# Patient Record
Sex: Female | Born: 1955 | Hispanic: No | Marital: Single | Smoking: Current every day smoker
Health system: Southern US, Community
[De-identification: ages and names within clinical notes are randomized; demographics above are authoritative.]

## PROBLEM LIST (undated history)

## (undated) DIAGNOSIS — F419 Anxiety disorder, unspecified: Secondary | ICD-10-CM

## (undated) DIAGNOSIS — Z86010 Personal history of colonic polyps: Secondary | ICD-10-CM

## (undated) DIAGNOSIS — G8929 Other chronic pain: Secondary | ICD-10-CM

## (undated) DIAGNOSIS — F32A Depression, unspecified: Secondary | ICD-10-CM

## (undated) DIAGNOSIS — B1920 Unspecified viral hepatitis C without hepatic coma: Secondary | ICD-10-CM

## (undated) DIAGNOSIS — F329 Major depressive disorder, single episode, unspecified: Secondary | ICD-10-CM

## (undated) DIAGNOSIS — M549 Dorsalgia, unspecified: Secondary | ICD-10-CM

## (undated) DIAGNOSIS — T7840XA Allergy, unspecified, initial encounter: Secondary | ICD-10-CM

## (undated) DIAGNOSIS — D649 Anemia, unspecified: Secondary | ICD-10-CM

## (undated) DIAGNOSIS — C210 Malignant neoplasm of anus, unspecified: Secondary | ICD-10-CM

## (undated) DIAGNOSIS — E785 Hyperlipidemia, unspecified: Secondary | ICD-10-CM

## (undated) HISTORY — DX: Dorsalgia, unspecified: M54.9

## (undated) HISTORY — DX: Other chronic pain: G89.29

## (undated) HISTORY — DX: Hyperlipidemia, unspecified: E78.5

## (undated) HISTORY — DX: Anemia, unspecified: D64.9

## (undated) HISTORY — PX: COLONOSCOPY: SHX174

## (undated) HISTORY — DX: Allergy, unspecified, initial encounter: T78.40XA

## (undated) HISTORY — DX: Depression, unspecified: F32.A

## (undated) HISTORY — DX: Personal history of colonic polyps: Z86.010

## (undated) HISTORY — DX: Unspecified viral hepatitis C without hepatic coma: B19.20

## (undated) HISTORY — DX: Anxiety disorder, unspecified: F41.9

## (undated) HISTORY — PX: POLYPECTOMY: SHX149

## (undated) HISTORY — DX: Major depressive disorder, single episode, unspecified: F32.9

---
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
## 2009-02-17 ENCOUNTER — Emergency Department (HOSPITAL_COMMUNITY): Admission: EM | Admit: 2009-02-17 | Discharge: 2009-02-17 | Payer: Self-pay | Admitting: Emergency Medicine

## 2009-12-01 ENCOUNTER — Ambulatory Visit: Payer: Self-pay | Admitting: Family Medicine

## 2009-12-01 DIAGNOSIS — E663 Overweight: Secondary | ICD-10-CM | POA: Insufficient documentation

## 2009-12-01 DIAGNOSIS — M25569 Pain in unspecified knee: Secondary | ICD-10-CM | POA: Insufficient documentation

## 2009-12-01 DIAGNOSIS — M549 Dorsalgia, unspecified: Secondary | ICD-10-CM | POA: Insufficient documentation

## 2009-12-06 ENCOUNTER — Ambulatory Visit: Payer: Self-pay | Admitting: Family Medicine

## 2009-12-06 ENCOUNTER — Encounter: Payer: Self-pay | Admitting: Family Medicine

## 2009-12-09 ENCOUNTER — Encounter: Payer: Self-pay | Admitting: Family Medicine

## 2009-12-13 DIAGNOSIS — B1710 Acute hepatitis C without hepatic coma: Secondary | ICD-10-CM

## 2009-12-13 LAB — CONVERTED CEMR LAB
AST: 165 units/L — ABNORMAL HIGH (ref 0–37)
Albumin: 3.7 g/dL (ref 3.5–5.2)
BUN: 11 mg/dL (ref 6–23)
Calcium: 9.4 mg/dL (ref 8.4–10.5)
Chloride: 104 meq/L (ref 96–112)
Glucose, Bld: 92 mg/dL (ref 70–99)
HCV Ab: REACTIVE — AB
HDL: 32 mg/dL — ABNORMAL LOW (ref >39)
LDL Cholesterol: 126 mg/dL — ABNORMAL HIGH (ref 0–99)
Potassium: 4.4 meq/L (ref 3.5–5.3)
Sodium: 137 meq/L (ref 135–145)
Total Protein: 9 g/dL — ABNORMAL HIGH (ref 6.0–8.3)

## 2009-12-16 ENCOUNTER — Encounter: Admission: RE | Admit: 2009-12-16 | Discharge: 2009-12-16 | Payer: Self-pay | Admitting: Family Medicine

## 2009-12-16 ENCOUNTER — Encounter: Payer: Self-pay | Admitting: Family Medicine

## 2009-12-16 ENCOUNTER — Ambulatory Visit: Payer: Self-pay | Admitting: Family Medicine

## 2009-12-26 ENCOUNTER — Telehealth: Payer: Self-pay | Admitting: Family Medicine

## 2010-01-06 ENCOUNTER — Telehealth: Payer: Self-pay | Admitting: Family Medicine

## 2010-01-18 ENCOUNTER — Encounter: Payer: Self-pay | Admitting: Family Medicine

## 2010-02-14 ENCOUNTER — Ambulatory Visit: Payer: Self-pay | Admitting: Family Medicine

## 2010-02-14 DIAGNOSIS — F341 Dysthymic disorder: Secondary | ICD-10-CM

## 2010-02-28 ENCOUNTER — Ambulatory Visit: Payer: Self-pay | Admitting: Family Medicine

## 2010-02-28 DIAGNOSIS — N952 Postmenopausal atrophic vaginitis: Secondary | ICD-10-CM | POA: Insufficient documentation

## 2010-03-01 ENCOUNTER — Encounter: Payer: Self-pay | Admitting: Family Medicine

## 2010-03-06 LAB — CONVERTED CEMR LAB: Pap Smear: NEGATIVE

## 2010-03-08 ENCOUNTER — Encounter: Payer: Self-pay | Admitting: Family Medicine

## 2010-03-15 ENCOUNTER — Encounter: Payer: Self-pay | Admitting: Family Medicine

## 2010-04-04 ENCOUNTER — Telehealth: Payer: Self-pay | Admitting: Family Medicine

## 2010-04-13 ENCOUNTER — Encounter: Payer: Self-pay | Admitting: Family Medicine

## 2010-04-13 ENCOUNTER — Ambulatory Visit
Admission: RE | Admit: 2010-04-13 | Discharge: 2010-04-13 | Payer: Self-pay | Source: Home / Self Care | Attending: Gastroenterology | Admitting: Gastroenterology

## 2010-04-13 DIAGNOSIS — B182 Chronic viral hepatitis C: Secondary | ICD-10-CM

## 2010-04-24 ENCOUNTER — Telehealth: Payer: Self-pay | Admitting: Family Medicine

## 2010-04-28 ENCOUNTER — Other Ambulatory Visit: Payer: Self-pay | Admitting: Gastroenterology

## 2010-04-28 DIAGNOSIS — B1920 Unspecified viral hepatitis C without hepatic coma: Secondary | ICD-10-CM

## 2010-05-02 NOTE — Miscellaneous (Signed)
Summary: Referral  Patient came in to find out about her referral.  She has still not heard anything, and is concerned about this. Her phone number  is [phone]. Bradly Bienenstock  January 18, 2010 1:20 PM  The patient's referral was put in on 12-20-09. I have sent a flag to [facility] to find out where we are on this issue. I have no news for the patient at this time. These referrals take a while when our patient's are Bar Spec. Very Frustrating but please let her know.  Jamie Brookes MD  January 18, 2010 4:21 PM  called and lvm for pt and let her know that we are working on her referral also callled and lvm for hep c clinic for them to call me back concerning this pt's appt.Marland KitchenMarland KitchenLoralee Pacas CMA  January 27, 2010 2:23 PM  Appended Document: Referral called hep c clinic and lvm again for them to call back have NOT heard back

## 2010-05-02 NOTE — Progress Notes (Signed)
Summary: Rx Req  Phone Note Refill Request Call back at Home Phone [phone] Message from:  Patient  Refills Requested: Medication #1:  OXYCODONE HCL 10 MG TABS take 1 tab every 6 hours as needed for back and knee pain. Do not take Tylenol. Initial call taken by: Clydell Hakim,  December 26, 2009 1:47 PM    Prescriptions: OXYCODONE HCL 10 MG TABS (OXYCODONE HCL) take 1 tab every 6 hours as needed for back and knee pain. Do not take Tylenol  #60 +60 x 0   Entered and Authorized by:   Jamie Brookes MD   Signed by:   Jamie Brookes MD on 12/28/2009   Method used:   Handwritten   RxID:   [id number]

## 2010-05-02 NOTE — Assessment & Plan Note (Signed)
Summary: NP,back and knee pain, need for labs and testing   Vital Signs:  Patient profile:   55 year old female Height:      62 inches Weight:      158 pounds BMI:     29.00 Temp:     98.8 degrees F oral Pulse rate:   72 / minute BP sitting:   107 / 72  (left arm) Cuff size:   regular  Vitals Entered By: Loralee Pacas CMA (December 01, 2009 9:31 AM)  Primary Care Khloee Garza:  Jamie Brookes MD  CC:  NP visit.  History of Present Illness: 55 y/o F with multiple medical problems comes in today to establish care. She has not had any form of medical care for the last 7 years. She has not had any screening exams in 7 years as well. She comes with her boyfriend Annette Stable who knows a lot of her past medical history and helps fill in the details when she "can't remember". She says that she was diagnosed with Hep C about 20 years ago. She does not have any of that paperwork and has not done anything about it. She has not had any complications. She does have some knee pain that started in 2009. She also has some back pain that started in 2009. She has been using other peoples pain medications. She has tried Lidoderm patches, Vicoden 10/325, Robaxin 500mg , Percocets, Advil, ASA, Excedrin and Icy Hot. She says that the best combination of pain meds for her back are Percocet and Lidoderm patches together. It takes her back pain from an 8 to a 4.  Pt also has a h/o a breast lump that was found in the Rt breast about 7 years ago. She never had anything done for this.   Habits & Providers  Alcohol-Tobacco-Diet     Tobacco Status: current     Tobacco Counseling: to quit use of tobacco products     Cigarette Packs/Day: 1.0     Year Started: 1971     Pack years: 87  Exercise-Depression-Behavior     Does Patient Exercise: no     STD Risk: never     Drug Use: never     Seat Belt Use: always     Sun Exposure: infrequent  Current Medications (verified): 1)  Metamucil Multihealth Fiber 58.6 % Powd  (Psyllium) .... Otc 2)  Calcium-Vitamin D 250-125 Mg-Unit Tabs (Calcium Carbonate-Vitamin D) 3)  Oxycodone Hcl 10 Mg Tabs (Oxycodone Hcl) .... Take 1 Tab Every 6 Hours As Needed For Back and Knee Pain. Do Not Take Tylenol  Allergies (verified): No Known Drug Allergies  Past History:  Past Medical History: Diagnosed with Hep C ? > 20 years ago bilateral ankle swelling and twisting (since childhood) Rt knee pain (2009) Low back pain that radiates to Rt leg (mid calf) started in 2009  Past Surgical History: tonsilectomy  Family History: dad: 28, lung ca,  Mom: 75, DM2, Rheum arthritis, osteoporosis Sister: 80, DM2  Social History: originally from [place], moved here in 2010. Lives with Chrissie Noa ([address]) [place] [phone]) "friend' and confidant, he drives her around, never has been married, no children,  does not drink EtOH now (use to from age [age]), Smoked tobacco since age 89, now 1 ppd, smoked 1.5 ppd for 5-6 years, no drugs, no exercise, no STD's, lives with a dog and catSmoking Status:  current Packs/Day:  1.0 Does Patient Exercise:  no STD Risk:  never Drug Use:  never  Seat Belt Use:  always Sun Exposure-Excessive:  infrequent  Review of Systems        vitals reviewed and pertinent negatives and positives seen in HPI   Physical Exam  General:  Well-developed,well-nourished,in no acute distress; alert,appropriate and cooperative throughout examination Head:  Normocephalic and atraumatic without obvious abnormalities. No apparent alopecia or balding. Eyes:  No corneal or conjunctival inflammation noted. EOMI. Perrla. Funduscopic exam benign, without hemorrhages, exudates or papilledema. Vision grossly normal. Ears:  External ear exam shows no significant lesions or deformities.  Otoscopic examination reveals clear canals, tympanic membranes are intact bilaterally without bulging, retraction, inflammation or discharge. Hearing is grossly normal bilaterally. Nose:   External nasal examination shows no deformity or inflammation. Nasal mucosa are pink and moist without lesions or exudates. Mouth:  Oral mucosa and oropharynx without lesions or exudates.  Teeth in good repair. Neck:  No deformities, masses, or tenderness noted. Breasts:  No mass, nodules, thickening, tenderness, bulging, retraction, inflamation, nipple discharge or skin changes noted.  no lump felt Lungs:  Normal respiratory effort, chest expands symmetrically. Lungs are clear to auscultation, no crackles or wheezes. Heart:  Normal rate and regular rhythm. S1 and S2 normal without gallop, murmur, click, rub or other extra sounds. Abdomen:  Bowel sounds positive,abdomen soft and non-tender without masses, organomegaly or hernias noted. Msk:  No deformity or scoliosis noted of thoracic or lumbar spine.   No tenderness in left knee, Rt knee has tenderness to palpation behind the lateral aspect of the knee. no swelling, no erythema, no warmth.  Extremities:  No clubbing, cyanosis, edema, or deformity noted with normal full range of motion of all joints.     Impression & Recommendations:  Problem # 1:  BACK PAIN, CHRONIC (ICD-724.5) Assessment New Pt has been using Percocet 10-325. With her possible Hepatitis C she should not use anything with Tylenol. Also it is better for her to not use other peoples meds. When she gets [facility] set up we will do a more thoroug work up on her back to see if there is some compression in her spine or othr causes for her chronic back pain. Plan to get x-ray when she is set up with [facility]. Recommended using a heating pad as well.   Her updated medication list for this problem includes:    Oxycodone Hcl 10 Mg Tabs (Oxycodone hcl) .Marland Kitchen... Take 1 tab every 6 hours as needed for back and knee pain. do not take tylenol  Problem # 2:  KNEE PAIN, RIGHT, CHRONIC (ICD-719.46) Assessment: New Pt is also having some chronic knee pain. She has had some knee pain for 2 years. Does  not remember an initial injury. Suspect OA but will get an x-ray of her knees (standing and sitting) when she gets [facility] set up. For now her pain med for her back should help. Using ice should help as well.   Her updated medication list for this problem includes:    Oxycodone Hcl 10 Mg Tabs (Oxycodone hcl) .Marland Kitchen... Take 1 tab every 6 hours as needed for back and knee pain. do not take tylenol  Problem # 3:  ? of HEPATITIS C (ICD-070.51) Assessment: New  Pt will need blood work to determine if she has Hepatitis.   Future Orders: Comp Met-[facility] [phone]) ... 12/14/2010 Hep C Ab-[facility] ([id number]) ... 12/20/2010  Problem # 4:  ? of BREAST MASS (ICD-611.72)  Pt is  long past due for a mammogram. No breast lump felt on exam today  but will need to get diagnostic mammogram when [facility] is set up.   Future Orders: Mammogram (Diagnostic) (Mammo) ... 12/20/2010  Complete Medication List: 1)  Metamucil Multihealth Fiber 58.6 % Powd (Psyllium) .... Otc 2)  Calcium-vitamin D 250-125 Mg-unit Tabs (Calcium carbonate-vitamin d) 3)  Oxycodone Hcl 10 Mg Tabs (Oxycodone hcl) .... Take 1 tab every 6 hours as needed for back and knee pain. do not take tylenol  Other Orders: Future Orders: Lipid-[facility] ([id number]) ... 12/15/2010 Colonoscopy (Colon) ... 12/13/2010  Patient Instructions: 1)  It was nice to meet you today. 2)  Give our office a call when you have the [employer] figured out.  3)  Use frozen peas on your knees, heating pad on your back.  4)  Take Motrin (400 mg every 6 hours ) as needed for back pain.  5)  Use the oxycodone for the pain only when it is really bad.  6)  Come back when you have seen Rudell Cobb so we can start working on the other things.  Prescriptions: OXYCODONE HCL 10 MG TABS (OXYCODONE HCL) take 1 tab every 6 hours as needed for back and knee pain. Do not take Tylenol  #90 x 0   Entered and Authorized by:   Jamie Brookes MD   Signed by:   Jamie Brookes MD on  12/05/2009   Method used:   Handwritten   RxID:   [id number]   Appended Document: NP,back and knee pain, need for labs and testing Pt referred to [facility] specialty clinic on 04-13-10 at 3:00pm.

## 2010-05-02 NOTE — Progress Notes (Signed)
Summary: please let the pt know  Phone Note Call from Patient   Caller: Patient Call For: [phone] Summary of Call: Patient calling regarding referral to a clinic for her hepatitis.  Was told that we would call her with that info.  Haven't heard anything since her appt on 9/1.   Patient want to get this started asap and would like to be notified when referral is made.  Follow-up for Phone Call        The referral was made on 12-20-09 but it takes a while to get the appointment.  Follow-up by: Jamie Brookes MD,  January 06, 2010 4:44 PM  Additional Follow-up for Phone Call Additional follow up Details #1::        Please see new note and let her know.  Additional Follow-up by: Jamie Brookes MD,  January 18, 2010 4:22 PM

## 2010-05-02 NOTE — Miscellaneous (Signed)
Summary: Re:  GI referral. (can't be done at this time)  Clinical Lists Changes   received notification from [facility] that they are unable to complete the GI referral for colonscopy at this time due to lack  of physicians in this speciality group. they will notify patient when they can process the referral. Theresia Lo RN  December 09, 2009 2:05 PM  Noted. Jamie Brookes MD  December 09, 2009 2:29 PM

## 2010-05-02 NOTE — Miscellaneous (Signed)
Summary: medication question  Clinical Lists [facility] and  [facility] cannot provide Estrace cream but they can provide premarin. also they do have lidoderm patch available  if MD prescribes. form placed in MD box. .   patient is also asking about colonscopy referral. advised that there are no doctors available at this time that participate with [facility] to schedule appointment  with.    see note from 12/09/2009.  Will ask MD if referral needs to be sent to [facility] or [place] . Theresia Lo RN  March 08, 2010 11:46 AM    Medications: Changed medication from ESTRACE 0.1 MG/GM CREA (ESTRADIOL) take 2 grams IV for the first 1-2 weeks, then decreased to 1 gram 1-3 times a week for maintenence.  qs for 2 months to PREMARIN 0.625 MG/GM CREA (ESTROGENS, CONJUGATED) apply 1 gram of the cream vaginally on Sunday and Wednesday (twice weekly) - Signed Added new medication of LIDODERM 5 % PTCH (LIDOCAINE) apply to area of pain on back for no more than 12 hours - Signed Changed medication from PREMARIN 0.625 MG/GM CREA (ESTROGENS, CONJUGATED) apply 1 gram of the cream vaginally on Sunday and Wednesday (twice weekly) to PREMARIN 0.625 MG/GM CREA (ESTROGENS, CONJUGATED) apply 1 gram of the cream vaginally on Sunday and Wednesday (twice weekly) Give QS for 1 month - Signed Rx of PREMARIN 0.625 MG/GM CREA (ESTROGENS, CONJUGATED) apply 1 gram of the cream vaginally on Sunday and Wednesday (twice weekly);  #1 x 0;  Signed;  Entered by: Breyton Vanscyoc MD;  Authorized by: Yarisa Lynam MD;  Method used: Print then Give to Patient Rx of LIDODERM 5 % PTCH (LIDOCAINE) apply to area of pain on back for no more than 12 hours;  #30 x 1;  Signed;  Entered by: Lloyd Ayo MD;  Authorized by: Celester Lech MD;  Method used: Faxed to [facility], [address], [place], [place]  [address], Ph: [phone], Fax: [phone] Rx of PREMARIN 0.625 MG/GM CREA (ESTROGENS,  CONJUGATED) apply 1 gram of the cream vaginally on Sunday and Wednesday (twice weekly) Give QS for 1 month;  #1 x 3;  Signed;  Entered by: Quashon Jesus MD;  Authorized by: Kanai Hilger MD;  Method used: Faxed to [facility], [address], [place], [place]  [address], Ph: [phone], Fax: [phone]    Prescriptions: PREMARIN 0.625 MG/GM CREA (ESTROGENS, CONJUGATED) apply 1 gram of the cream vaginally on Sunday and Wednesday (twice weekly) Give QS for 1 month  #1 x 3   Entered and Authorized by:   Ikechukwu Cerny MD   Signed by:   Jury Caserta MD on 03/08/2010   Method used:   Faxed to ...       [facility] (retail)       [address]       , [place]  [address]       Ph: [phone]       Fax: [phone]   RxID:   [id number] LIDODERM 5 % PTCH (LIDOCAINE) apply to area of pain on back for no more than 12 hours  #30 x 1   Entered and Authorized by:   Sullivan Jacuinde MD   Signed by:   Shantoya Geurts MD on 03/08/2010   Method used:   Faxed to ...       [facility] (retail)       [address] [place]  [place], [place]  [address]       Ph: [phone]       Fax: [phone]   RxID:   [id number] PREMARIN 0.625 MG/GM CREA (ESTROGENS, CONJUGATED) apply 1 gram of the cream vaginally on Sunday and Wednesday (twice weekly)  #1 x 0   Entered and Authorized by:   Jamie Brookes MD   Signed by:   Jamie Brookes MD on 03/08/2010   Method used:   Print then Give to Patient   RxID:   [id number]   Appended Document: medication question Per Dr. Clotilde Dieter notified pt that dr. Clotilde Dieter can check fecal occult cards yearly, and send her for diagnositc colonoscopy if stool cards are abnormal. Also advised her that premarin and lidoderm patch were sent in.

## 2010-05-02 NOTE — Assessment & Plan Note (Signed)
Summary: pap and vaginal atrophy   Vital Signs:  Patient profile:   55 year old female Height:      62 inches Weight:      161.2 pounds BMI:     29.59 Temp:     98.4 degrees F oral Pulse rate:   73 / minute BP sitting:   149 / 72  (left arm) Cuff size:   regular  Vitals Entered By: Jimmy Footman, CMA (February 28, 2010 1:48 PM) CC: pap, vaginal atrophy Is Patient Diabetic? No Pain Assessment Patient in pain? no        Primary Care Ireene Ballowe:  Jamie Brookes MD  CC:  pap and vaginal atrophy.  History of Present Illness:  Pap: Pt comes in today for a pap smear. She has not had one for the last 5-6 years. We have no records of her pap smears. She is not having any problems. No h/o abnormal paps as far as she can remember.   Vaginal atrophy: Pt says that it is painful and unpleasent to have sex with her partner. She says that he takes Testosterone and wants to have sex all the time and that she is not interested because of the discomfort. She has had a decreased libido for the last 4 years. She has used KY jelly and says that does increase the lubrication but not the comfort or increase the desire.   Habits & Providers  Alcohol-Tobacco-Diet     Tobacco Status: current     Cigarette Packs/Day: 1.0  Current Medications (verified): 1)  Metamucil Multihealth Fiber 58.6 % Powd (Psyllium) .... Otc 2)  Calcium-Vitamin D 250-125 Mg-Unit Tabs (Calcium Carbonate-Vitamin D) 3)  Oxycodone Hcl 10 Mg Tabs (Oxycodone Hcl) .... Take 1 Tab Every 6-8 Hours As Needed For Back and Knee Pain. Do Not Take Tylenol 4)  Estrace 0.1 Mg/gm Crea (Estradiol) .... Take 2 Grams Iv For The First 1-2 Weeks, Then Decreased To 1 Gram 1-3 Times A Week For Maintenence.  Qs For 2 Months  Allergies (verified): No Known Drug Allergies  Review of Systems        vitals reviewed and pertinent negatives and positives seen in HPI   Physical Exam  Genitalia:  Normal introitus for age, no external lesions, no  vaginal discharge, mucosa pink and dry, no vaginal or cervical lesions, some vaginal atrophy, no friaility or hemorrhage, normal uterus size and position, no adnexal masses or tenderness   Impression & Recommendations:  Problem # 1:  SPECIAL SCREENING FOR MALIGNANT NEOPLASMS COLON (ICD-V76.51) Assessment New We have no record of normal or abnormal paps. Plan to get a pap smear today at the patient's request. If it is normal she will not need many more. She is post-menopausal.  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 Boyfriend wants sex often and she says it is painful because it is dry. Because of Hep C will start with lower dose of vaginal cream.   Her updated medication list for this problem includes:    Estrace 0.1 Mg/gm Crea (Estradiol) .Marland Kitchen... Take 2 grams iv for the first 1-2 weeks, then decreased to 1 gram 1-3 times a week for maintenence.  qs for 2 months  Orders: [facility]- Est Level  3 ([address])  Complete Medication List: 1)  Metamucil Multihealth Fiber 58.6 % Powd (Psyllium) .... Otc 2)  Calcium-vitamin D 250-125 Mg-unit  Tabs (Calcium carbonate-vitamin d) 3)  Oxycodone Hcl 10 Mg Tabs (Oxycodone hcl) .... Take 1 tab every 6-8 hours as needed for back and knee pain. do not take tylenol 4)  Estrace 0.1 Mg/gm Crea (Estradiol) .... Take 2 grams iv for the first 1-2 weeks, then decreased to 1 gram 1-3 times a week for maintenence.  qs for 2 months  Patient Instructions: 1)  It was nice to see you today.  2)  I refilled your Oxycodone with 2 written refill prescriptions.  3)  You will need to have a visti before 05-08-09 to get a refill written and to see how you are doing. 4)  My office will call you with results from your pap smear.  Prescriptions: ESTRACE 0.1 MG/GM CREA (ESTRADIOL) take 2 grams IV for the first 1-2 weeks, then decreased  to 1 gram 1-3 times a week for maintenence.  qs for 2 months  #1 x 0   Entered and Authorized by:   Jamie Brookes MD   Signed by:   Jamie Brookes MD on 02/28/2010   Method used:   Handwritten   RxID:   [id number] OXYCODONE HCL 10 MG TABS (OXYCODONE HCL) take 1 tab every 6-8 hours as needed for back and knee pain. Do not take Tylenol  #90 x 0   Entered and Authorized by:   Jamie Brookes MD   Signed by:   Jamie Brookes MD on 02/28/2010   Method used:   Print then Give to Patient   RxID:   [id number]    Orders Added: 1)  Pap Smear-[facility] [[id number]] 2)  [facility]- Est Level  3 [01027]

## 2010-05-02 NOTE — Miscellaneous (Signed)
Summary: sent medical records  Clinical Lists Changes sent medical records to disability determination services all OV in 2011. [employer] [place]

## 2010-05-02 NOTE — Assessment & Plan Note (Signed)
Summary: Hep C, Depresion, Back pain   Vital Signs:  Patient profile:   56 year old female Weight:      159 pounds Temp:     98.5 degrees F oral Pulse rate:   67 / minute Pulse rhythm:   regular BP sitting:   135 / 82  (left arm) Cuff size:   regular  Vitals Entered By: Loralee Pacas CMA (February 14, 2010 11:06 AM) CC: Hep C, depression, backand knee pain Is Patient Diabetic? No   Primary Care Provider:  Jamie Brookes MD  CC:  Hep C, depression, and backand knee pain.  History of Present Illness: Hep C: Discussed with patient the issues of not sharing anything that has blood with her partner (no toothbrushes, nail clippers etc...). She has gotten an appointment with the Hepatitis clinic on Jan 12 at 2:45. Pt given my card so they can have reports and results sent to me as well.   Depression: Pt has some depression with her diagnosis and her pain issues (back and knee). She wants to approach it in a natural way if she can. We discussed foods that are high in Tryptophan to that she can help boost her serotonin levels. Pt given printed page about a website that has more info on this topic. Pt is welcome to start antidepressents today but did not want to do this. She will contact me in the future if she would like to start a pill.   Back and Knee pain: Pt says that her pain is not completely controlled with only 2 pills of Oxycodone a day. Her partner gives her the meds every day and she does not take more than she should but says that her pain is coming back every day. She would like to go back up to 3 pills a day. Her pain is waking her up at 3am in the morning. Pt had back pain that started in the 1980's, was worse in the 1990'sand has gotten even worse in the 2000's.   Preventive: Pt wanted a GYN referral to get pap smears done. I let her know we can do them here.   Habits & Providers  Alcohol-Tobacco-Diet     Tobacco Status: current     Tobacco Counseling: to quit use of  tobacco products     Cigarette Packs/Day: 1.0     Year Started: 1971     Pack years: 73  Exercise-Depression-Behavior     Have you felt down or hopeless? yes     Have you felt little pleasure in things? yes     Depression Counseling: further diagnostic testing and/or other treatment is indicated     Seat Belt Use: always  Current Medications (verified): 1)  Metamucil [employer] Fiber 58.6 % Powd (Psyllium) .... Otc 2)  Calcium-Vitamin D 250-125 Mg-Unit Tabs (Calcium Carbonate-Vitamin D) 3)  Oxycodone Hcl 10 Mg Tabs (Oxycodone Hcl) .... Take 1 Tab Every 6-8 Hours As Needed For Back and Knee Pain. Do Not Take Tylenol  Allergies (verified): No Known Drug Allergies  Review of Systems        vitals reviewed and pertinent negatives and positives seen in HPI   Physical Exam  General:  Well-developed,well-nourished,in no acute distress; alert,appropriate and cooperative throughout examination Psych:  normally interactive, good eye contact, and dysphoric affect.     Impression & Recommendations:  Problem # 1:  HEPATITIS C (ICD-070.51) Assessment Unchanged Pt has an appointment with the Hep C clinic coming up. encouraged to make it  to that appointment. Pt appears motivated to see what can be done for this disease.   Orders: [facility]- Est  Level 4 (16109)  Problem # 2:  DYSTHYMIC DISORDER (ICD-300.4) Assessment: Deteriorated Pt has a PHQ9 score of 19 and notes that it is very Difficult taking care of things at home. Her friend and confidant who lives with her Annette Stable) helps her do many things including take her meds at the proper times. I suspect that a lot of this is related to her diagnosis of Hep C and having just moved to this area from [place] and having some chronic pain issues. Pt does not want to take meds at this time. Discussed foods that increase Serotonin ad pt given website to look up. Will discuss with her again when she comes for her pap smear.   Orders: [facility]- Est  Level 4  (99214)  Problem # 3:  BACK PAIN, CHRONIC (ICD-724.5) Assessment: Unchanged Pt has had back pain since the 1980's. I would consider PT for this patient in the future (after she has a handle on what's happening witn her liver). Will treat the pain with Oxycodone and go back to up 3 times a day like we were doing before.   Her updated medication list for this problem includes:    Oxycodone Hcl 10 Mg Tabs (Oxycodone hcl) .Marland Kitchen... Take 1 tab every 6-8 hours as needed for back and knee pain. do not take tylenol  Orders: [facility]- Est  Level 4 (99214)  Complete Medication List: 1)  Metamucil [employer] Fiber 58.6 % Powd (Psyllium) .... Otc 2)  Calcium-vitamin D 250-125 Mg-unit Tabs (Calcium carbonate-vitamin d) 3)  Oxycodone Hcl 10 Mg Tabs (Oxycodone hcl) .... Take 1 tab every 6-8 hours as needed for back and knee pain. do not take tylenol  Patient Instructions: 1)  Please schedule a pap smear at the front desk as you leave.  2)  I have increased your Oxycodone.  3)  I'm glad you have an appointment with the liver doctor.  Prescriptions: OXYCODONE HCL 10 MG TABS (OXYCODONE HCL) take 1 tab every 6-8 hours as needed for back and knee pain. Do not take Tylenol  #90 x 0   Entered and Authorized by:   Jamie Brookes MD   Signed by:   Jamie Brookes MD on 02/20/2010   Method used:   Handwritten   RxID:   [id number]    Orders Added: 1)  [facility]- Est  Level 4 [95621]

## 2010-05-03 ENCOUNTER — Encounter: Payer: Self-pay | Admitting: *Deleted

## 2010-05-04 NOTE — Miscellaneous (Signed)
Summary: [facility] Rx's to fax  Clinical Lists Changes  Medications: Changed medication from LIDODERM 5 % PTCH (LIDOCAINE) apply to area of pain on back for no more than 12 hours to LIDODERM 5 % PTCH (LIDOCAINE) apply to area of pain on back for no more than 12 hours - Signed Changed medication from PREMARIN 0.625 MG/GM CREA (ESTROGENS, CONJUGATED) apply 1 gram of the cream vaginally on Sunday and Wednesday (twice weekly) Give QS for 1 month to PREMARIN 0.625 MG/GM CREA (ESTROGENS, CONJUGATED) apply 1 gram of the cream vaginally on Sunday and Wednesday (twice weekly) Give QS for 90 days - Signed Rx of LIDODERM 5 % PTCH (LIDOCAINE) apply to area of pain on back for no more than 12 hours;  #90 x 1;  Signed;  Entered by: Jamie Brookes MD;  Authorized by: Jamie Brookes MD;  Method used: Print then Give to Patient Rx of PREMARIN 0.625 MG/GM CREA (ESTROGENS, CONJUGATED) apply 1 gram of the cream vaginally on Sunday and Wednesday (twice weekly) Give QS for 90 days;  #90 x 1;  Signed;  Entered by: Jamie Brookes MD;  Authorized by: Jamie Brookes MD;  Method used: Print then Give to Patient    Prescriptions: PREMARIN 0.625 MG/GM CREA (ESTROGENS, CONJUGATED) apply 1 gram of the cream vaginally on Sunday and Wednesday (twice weekly) Give QS for 90 days  #90 x 1   Entered and Authorized by:   Jamie Brookes MD   Signed by:   Jamie Brookes MD on 03/15/2010   Method used:   Print then Give to Patient   RxID:   [id number] LIDODERM 5 % PTCH (LIDOCAINE) apply to area of pain on back for no more than 12 hours  #90 x 1   Entered and Authorized by:   Jamie Brookes MD   Signed by:   Jamie Brookes MD on 03/15/2010   Method used:   Print then Give to Patient   RxID:   [id number]

## 2010-05-04 NOTE — Progress Notes (Signed)
Summary: Referral  Phone Note Call from Patient Call back at Home Phone [phone]   Reason for Call: Talk to Nurse, Referral Summary of Call: pt was suppose to get colonoscopy, has orange card but no doctors avail right now, pt says we suggested another type test, wants to go for that but not sure what the name of it was.  Initial call taken by: Knox Royalty,  April 04, 2010 11:43 AM  Follow-up for Phone Call        will forward message to MD .  Follow-up by: Theresia Lo RN,  April 04, 2010 11:53 AM  Additional Follow-up for Phone Call Additional follow up Details #1::        LM on phone for patient, there is a group doing a study in our office looking at access to colonoscopy's. I will talk to them next week (they are not here this week) and see how we can best help her get access to come type of screening and if she would qualify for their study.  Additional Follow-up by: Jamie Brookes MD,  April 04, 2010 1:56 PM

## 2010-05-04 NOTE — Progress Notes (Signed)
Summary: Oxycodone refill   Phone Note Refill Request   Refills Requested: Medication #1:  OXYCODONE HCL 10 MG TABS take 1 tab every 6-8 hours as needed for back and knee pain. Do not take Tylenol Initial call taken by: Abundio Miu,  April 24, 2010 10:38 AM    New/Updated Medications: OXYCODONE HCL 10 MG TABS (OXYCODONE HCL) take 1 tab every 6-8 hours as needed for back and knee pain. Do not take Tylenol Prescriptions: OXYCODONE HCL 10 MG TABS (OXYCODONE HCL) take 1 tab every 6-8 hours as needed for back and knee pain. Do not take Tylenol  #90 x 0   Entered and Authorized by:   Jamie Brookes MD   Signed by:   Jamie Brookes MD on 04/26/2010   Method used:   Handwritten   RxID:   [id number]

## 2010-05-09 ENCOUNTER — Other Ambulatory Visit: Payer: Self-pay | Admitting: Interventional Radiology

## 2010-05-09 ENCOUNTER — Ambulatory Visit (HOSPITAL_COMMUNITY): Payer: Self-pay

## 2010-05-09 ENCOUNTER — Ambulatory Visit (HOSPITAL_COMMUNITY)
Admission: RE | Admit: 2010-05-09 | Discharge: 2010-05-09 | Disposition: A | Discharge status: Home / Self Care | Payer: Self-pay | Source: Ambulatory Visit | Attending: Gastroenterology | Admitting: Gastroenterology

## 2010-05-09 DIAGNOSIS — B1920 Unspecified viral hepatitis C without hepatic coma: Secondary | ICD-10-CM | POA: Insufficient documentation

## 2010-05-09 LAB — CBC
MCV: 91.6 fL (ref 78.0–100.0)
Platelets: 160 10*3/uL (ref 150–400)
RBC: 4.63 MIL/uL (ref 3.87–5.11)
RDW: 13.1 % (ref 11.5–15.5)
WBC: 8.4 10*3/uL (ref 4.0–10.5)

## 2010-05-09 LAB — APTT: aPTT: 30 seconds (ref 24–37)

## 2010-05-09 LAB — PROTIME-INR
INR: 1.15 (ref 0.00–1.49)
Prothrombin Time: 14.9 seconds (ref 11.6–15.2)

## 2010-05-10 NOTE — Consult Note (Signed)
Summary: [facility]  [facility]   Imported By: Knox Royalty 04/27/2010 12:02:59  _____________________________________________________________________  External Attachment:    Type:   Image     Comment:   External Document

## 2010-05-30 ENCOUNTER — Ambulatory Visit (INDEPENDENT_AMBULATORY_CARE_PROVIDER_SITE_OTHER): Payer: Self-pay

## 2010-05-30 ENCOUNTER — Telehealth: Payer: Self-pay | Admitting: *Deleted

## 2010-05-30 ENCOUNTER — Inpatient Hospital Stay (INDEPENDENT_AMBULATORY_CARE_PROVIDER_SITE_OTHER)
Admission: RE | Admit: 2010-05-30 | Discharge: 2010-05-30 | Disposition: A | Discharge status: Home / Self Care | Payer: Self-pay | Source: Ambulatory Visit | Attending: Family Medicine | Admitting: Family Medicine

## 2010-05-30 DIAGNOSIS — J40 Bronchitis, not specified as acute or chronic: Secondary | ICD-10-CM

## 2010-05-30 NOTE — Telephone Encounter (Signed)
Patient brings in form form [employer]  that needs original signature form MD . Lisa Nolan be a photcopy. Form placed in MD box. Please call when completed at [phone]

## 2010-05-30 NOTE — Telephone Encounter (Signed)
Form signed by Dr. Clotilde Dieter . Message left on voicemail that  form is ready for pick up.

## 2010-07-05 LAB — URINALYSIS, ROUTINE W REFLEX MICROSCOPIC
Bilirubin Urine: NEGATIVE
Ketones, ur: NEGATIVE mg/dL
Nitrite: NEGATIVE
Protein, ur: NEGATIVE mg/dL
Urobilinogen, UA: 1 mg/dL (ref 0.0–1.0)

## 2010-07-26 ENCOUNTER — Other Ambulatory Visit: Payer: Self-pay | Admitting: Family Medicine

## 2010-07-26 ENCOUNTER — Telehealth: Payer: Self-pay | Admitting: Family Medicine

## 2010-07-26 MED ORDER — OXYCODONE HCL 10 MG PO TABS: ORAL_TABLET | ORAL | Status: DC

## 2010-07-26 NOTE — Telephone Encounter (Signed)
LVM for patient to call back to inform of below 

## 2010-07-26 NOTE — Telephone Encounter (Signed)
Needs refill on Oxycodone Please call when ready

## 2010-07-26 NOTE — Telephone Encounter (Signed)
Spoke with patient and informed of below 

## 2010-07-26 NOTE — Telephone Encounter (Signed)
Please let her know that she can pick u the Rx at the front desk. She is not actually due to refills until after the beginning of May but I have dated all the next 3 month prescriptions for the first of the month.  Please let her know.  Thanks.  Hospital doctor

## 2010-10-11 ENCOUNTER — Other Ambulatory Visit: Payer: Self-pay | Admitting: Family Medicine

## 2010-10-11 MED ORDER — ESTROGENS, CONJUGATED 0.625 MG/GM VA CREA: TOPICAL_CREAM | VAGINAL | Status: DC

## 2010-10-28 ENCOUNTER — Encounter: Payer: Self-pay | Admitting: Family Medicine

## 2010-10-31 ENCOUNTER — Encounter: Payer: Self-pay | Admitting: Family Medicine

## 2010-10-31 NOTE — Telephone Encounter (Signed)
De Nurse  spoke with patient and scheduled appointment with new PCP for 11/08/2010. Patient is requesting refill on pain medication now. Her last refill was 10/01/2010.   On 07/26/2010   Dr. Clotilde Dieter gave rx for oxycodone for  05/01, 06/01 and 07 /01. Patient wants to  have refilled now. Will forward to MD.

## 2010-10-31 NOTE — Telephone Encounter (Signed)
This encounter was created in error - please disregard.

## 2010-11-01 ENCOUNTER — Telehealth: Payer: Self-pay | Admitting: Family Medicine

## 2010-11-01 ENCOUNTER — Other Ambulatory Visit: Payer: Self-pay | Admitting: Family Medicine

## 2010-11-01 MED ORDER — OXYCODONE HCL 10 MG PO TABS: ORAL_TABLET | ORAL | Status: DC

## 2010-11-01 NOTE — Telephone Encounter (Signed)
rx re written by hand for 1 po q 8 prn pain #30 0 R and given to [employer]

## 2010-11-01 NOTE — Telephone Encounter (Signed)
Chart review for oxycodone--looks like she got 3 rx for 100 each--due on 1st of month---has appt next week w new provider Will give #30 for interim..she should be taking probably 3 a day at max although written for  q 4-6 Lisa Nolan   Rx Given to [employer]

## 2010-11-01 NOTE — Telephone Encounter (Signed)
Pt has an appt w/ de [facility] cru[facility] on 8/7 and she is out of her Lidaderm and oxycodone.  Needs enough to last until appt. Out pt Pharm

## 2010-11-07 ENCOUNTER — Encounter: Payer: Self-pay | Admitting: Family Medicine

## 2010-11-07 ENCOUNTER — Ambulatory Visit: Payer: Self-pay | Admitting: Family Medicine

## 2010-11-07 ENCOUNTER — Ambulatory Visit (INDEPENDENT_AMBULATORY_CARE_PROVIDER_SITE_OTHER): Payer: Self-pay | Admitting: Family Medicine

## 2010-11-07 VITALS — BP 127/79 | HR 84 | Temp 98.2°F | Ht 63.0 in | Wt 164.0 lb

## 2010-11-07 DIAGNOSIS — H9209 Otalgia, unspecified ear: Secondary | ICD-10-CM | POA: Insufficient documentation

## 2010-11-07 DIAGNOSIS — Z1239 Encounter for other screening for malignant neoplasm of breast: Secondary | ICD-10-CM

## 2010-11-07 DIAGNOSIS — Z23 Encounter for immunization: Secondary | ICD-10-CM

## 2010-11-07 DIAGNOSIS — M549 Dorsalgia, unspecified: Secondary | ICD-10-CM

## 2010-11-07 DIAGNOSIS — L814 Other melanin hyperpigmentation: Secondary | ICD-10-CM

## 2010-11-07 DIAGNOSIS — G8929 Other chronic pain: Secondary | ICD-10-CM

## 2010-11-07 DIAGNOSIS — L819 Disorder of pigmentation, unspecified: Secondary | ICD-10-CM

## 2010-11-07 DIAGNOSIS — Z1231 Encounter for screening mammogram for malignant neoplasm of breast: Secondary | ICD-10-CM

## 2010-11-07 MED ORDER — TETANUS-DIPHTH-ACELL PERTUSSIS 5-2.5-18.5 LF-MCG/0.5 IM SUSP: 0.5000 mL | Freq: Once | INTRAMUSCULAR | Status: DC

## 2010-11-07 MED ORDER — OXYCODONE HCL 10 MG PO TABS: ORAL_TABLET | ORAL | Status: DC

## 2010-11-07 NOTE — Patient Instructions (Signed)
Nice to meet you today. Please tell liver specialist about your ear pain. Please schedule a follow up visit with me in 4 weeks. Thank you.

## 2010-11-07 NOTE — Progress Notes (Signed)
  Subjective:    Patient ID: Lisa Nolan, female    DOB: [birth year], 55 y.o.   MRN: [id number]  HPI Patient presents to clinic with CC: ear odor and pain.  Symptoms started 2-3 weeks ago.  Pain has improved on its own, but patient concerned this is a side effect of Hepatitis C treatment.  She started taking medication for Hep C 3 months ago.  Associated symptoms: congestion.  Denies fever, chills, sweats, headache, nausea/vomiting.  Denies any associate URI symptoms.    Patient also needs refill Oxycodone for chronic back pain.  Patient agrees to discuss this further at next appointment.  She agrees to renewing pain contract.  Wants DEXA scan and mammogram referrals today.  Patient concerned about new "spot" on R cheek that is growing and changing shape.  She noticed it 2 weeks ago.  Denies any drainage from site.  Non-tender.  Denies itchiness.   Review of Systems     Objective:   Physical Exam  Constitutional: She appears well-developed and well-nourished. No distress.  HENT:  Head: Normocephalic and atraumatic.  Right Ear: Hearing and external ear normal. No drainage, swelling or tenderness. Tympanic membrane is scarred and erythematous. Tympanic membrane is not injected, not perforated and not bulging.  Left Ear: Hearing and external ear normal. No drainage, swelling or tenderness. Tympanic membrane is not injected, not scarred, not perforated, not erythematous and not bulging.  Skin:       Two 1.5 mm round, light brown plaques on R cheek; no erythema, no drainage         Assessment & Plan:

## 2010-11-07 NOTE — Assessment & Plan Note (Signed)
Ear pain may be side effect of Hep C medications. Discussed with Pharm student - Ribavirin may cause ear problems. Advised patient to bring issue up to Liver specialist this week. No evidence of otitis media/infection at this time. Return to clinic if symptoms worsen or develop fever, ear drainage, severe pain.

## 2010-11-07 NOTE — Assessment & Plan Note (Signed)
Few light brown, velvety moles on cheek are likely age spots. Advised patient to monitor for shape change or growth.  Will monitor closely.  Consider biopsy if suspicion for melanoma.

## 2010-11-07 NOTE — Assessment & Plan Note (Signed)
Will refill oxycodone today - 1 month supply. Patient to return to clinic in 4 weeks to discuss chronic pain. Re-new pain contract at that time.

## 2010-11-21 ENCOUNTER — Emergency Department (HOSPITAL_COMMUNITY)
Admission: EM | Admit: 2010-11-21 | Discharge: 2010-11-21 | Disposition: A | Discharge status: Home / Self Care | Payer: Self-pay | Attending: Emergency Medicine | Admitting: Emergency Medicine

## 2010-11-21 DIAGNOSIS — H60399 Other infective otitis externa, unspecified ear: Secondary | ICD-10-CM | POA: Insufficient documentation

## 2010-11-21 DIAGNOSIS — I10 Essential (primary) hypertension: Secondary | ICD-10-CM | POA: Insufficient documentation

## 2010-11-21 DIAGNOSIS — H9209 Otalgia, unspecified ear: Secondary | ICD-10-CM | POA: Insufficient documentation

## 2010-11-21 DIAGNOSIS — B1920 Unspecified viral hepatitis C without hepatic coma: Secondary | ICD-10-CM | POA: Insufficient documentation

## 2010-11-28 ENCOUNTER — Encounter: Payer: Self-pay | Admitting: Family Medicine

## 2010-11-28 ENCOUNTER — Ambulatory Visit (INDEPENDENT_AMBULATORY_CARE_PROVIDER_SITE_OTHER): Payer: Self-pay | Admitting: Family Medicine

## 2010-11-28 DIAGNOSIS — H9209 Otalgia, unspecified ear: Secondary | ICD-10-CM

## 2010-11-28 DIAGNOSIS — M549 Dorsalgia, unspecified: Secondary | ICD-10-CM

## 2010-11-28 MED ORDER — AMOXICILLIN 500 MG PO CAPS: 500.0000 mg | ORAL_CAPSULE | Freq: Two times a day (BID) | ORAL | Status: DC

## 2010-11-28 MED ORDER — OXYCODONE HCL 10 MG PO TABS: ORAL_TABLET | ORAL | Status: DC

## 2010-11-28 NOTE — Patient Instructions (Addendum)
It was nice to see you again. Please pick up Amoxicillin and take for 10 days. Please schedule a follow up appointment with me in 6 - 8 weeks.

## 2010-11-29 NOTE — Assessment & Plan Note (Signed)
Will need to discuss further at next visit. Will need to renew pain contract at that time. Will refill Oxycodone 90 day supply for now - pain is well controlled on current regimen. Follow up in 6 to 8 weeks.

## 2010-11-29 NOTE — Assessment & Plan Note (Signed)
Because patient's swap audiometry test revealed decreased hearing R ear, will treat with high dose Amoxicillin x 10 days. Advised patient to call liver specialist to make sure antibiotic safe to use. Will repeat ear exam in 6 to 8 weeks or sooner if symptoms worsen. Will repeat hearing test in 6 to 8 weeks.  If continues to endorse decreased hearing, will likely refer to ENT/Audiology.

## 2010-11-29 NOTE — Progress Notes (Signed)
  Subjective:    Patient ID: Lisa Nolan, female    DOB: Oct 06, 1955, 55 y.o.   MRN: [id number]  HPI  Patient complains of persistent R ear pain and decreased hearing.  Patient went to Urgent Care 3 weeks ago and was told she has otitis externa and given drops.  Pain has mostly resolved, but now patient complains of decreased hearing.  Started 3 weeks ago, but has become progressively worse.  Hears a "humming" in R ear.  Wants ears to be rechecked today.  Also, patient asked liver specialist denied any ototoxicity due to PEG-INF and Ribavirin.    Patient needs oxycodone refill for chronic back and knee pain.  Review of Systems Per HPI    Objective:   Physical Exam  Constitutional: No distress.  HENT:  Right Ear: There is swelling. No tenderness. No foreign bodies. Tympanic membrane is erythematous. Tympanic membrane is not retracted and not bulging. Tympanic membrane mobility is abnormal. No middle ear effusion. Decreased hearing is noted.  Left Ear: Hearing, tympanic membrane, external ear and ear canal normal.       R ear flushed, normal insufflation, normal rinne and weber exam, sweep audiometry revealed R side decreased hearing  Lymphadenopathy:    She has no cervical adenopathy.          Assessment & Plan:

## 2011-01-02 ENCOUNTER — Encounter: Payer: Self-pay | Admitting: Family Medicine

## 2011-01-02 ENCOUNTER — Ambulatory Visit (INDEPENDENT_AMBULATORY_CARE_PROVIDER_SITE_OTHER): Payer: Self-pay | Admitting: Family Medicine

## 2011-01-02 VITALS — BP 132/77 | HR 76 | Temp 98.3°F | Ht 63.0 in | Wt 158.4 lb

## 2011-01-02 DIAGNOSIS — F17200 Nicotine dependence, unspecified, uncomplicated: Secondary | ICD-10-CM

## 2011-01-02 DIAGNOSIS — Z23 Encounter for immunization: Secondary | ICD-10-CM

## 2011-01-02 DIAGNOSIS — M549 Dorsalgia, unspecified: Secondary | ICD-10-CM

## 2011-01-02 MED ORDER — OXYCODONE HCL 10 MG PO TABS: 10.0000 mg | ORAL_TABLET | Freq: Three times a day (TID) | ORAL | Status: DC | PRN

## 2011-01-02 MED ORDER — PNEUMOCOCCAL VAC POLYVALENT 25 MCG/0.5ML IJ INJ: 0.5000 mL | INJECTION | Freq: Once | INTRAMUSCULAR | Status: DC

## 2011-01-02 MED ORDER — OXYCODONE HCL 10 MG PO TABS: ORAL_TABLET | ORAL | Status: DC

## 2011-01-02 NOTE — Patient Instructions (Signed)
Please schedule appointment with Dr. Raymondo Band to discuss smoking cessation. You may schedule a follow up appointment with me in 3 months. Thank you.

## 2011-01-06 DIAGNOSIS — F17210 Nicotine dependence, cigarettes, uncomplicated: Secondary | ICD-10-CM | POA: Insufficient documentation

## 2011-01-06 NOTE — Assessment & Plan Note (Signed)
Advised patient to schedule appointment with Dr. Raymondo Band to discuss smoking cessation options that do not interfere with hepatitis medications/treatment.

## 2011-01-06 NOTE — Progress Notes (Signed)
  Subjective:    Patient ID: KAYELYN LEMON, female    DOB: Jul 25, 1955, 55 y.o.   MRN: [id number]  HPI    Review of Systems     Objective:   Physical Exam        Assessment & Plan:   Subjective:    NEVAYAH FAUST is a 55 y.o. female who presents for follow up of low back problems. Current symptoms include: pain in lumbar spine (aching, shooting and stabbing in character; 8/10 in severity). Currently takes oxycodone because she cannot take tylenol due to acute hepatitis.  Takes oxycodone 1 to 3 tablets per day depending on severity.  Has been taking this since 2010 when she was diagnosed with DJD of thoracic and lumbar spine.  Pain located mostly lower back and radiates to left lower extremity intermittently.  Oxycodone brings pain from 8 to 5. Lidoderm patches help relieve pain also.  Symptoms have not changed from the previous visit. Exacerbating factors identified by the patient are bending forwards and walking.    The following portions of the patient's history were reviewed and updated as appropriate: allergies, current medications, past medical history and past surgical history.    Objective:    General appearance: alert, cooperative and no distress Back: no skin lesions, erythema, or scars, symmetric, no curvature. ROM normal. No CVA tenderness., tender on palpation of lumbar spine and paraspinal muscles, mild pain with SLE Lungs: clear to auscultation bilaterally Heart: regular rate and rhythm Abdomen: soft, non-tender; bowel sounds normal Extremities: extremities normal, atraumatic, no cyanosis or edema    Assessment:    Sciatica possibly due to degenerative joint disease at intervertebral facet joints  vs. Chronic pain syndrome due to DJD   Plan:    Natural history and expected course discussed. Questions answered. Stretching exercises discussed. Ice to affected area as needed for local pain relief. Heat to affected area as needed for local pain relief. Follow-up  in 3 months.

## 2011-01-06 NOTE — Assessment & Plan Note (Signed)
Will give 90 day refill of Oxycodone. Obtained pain contract - patient signed. May consider referral to pain clinic in the future if patient shows signs of addiction or overuse. Will follow up in 3 months. Avoid Tylenol in setting of acute hepatitis.

## 2011-02-19 ENCOUNTER — Ambulatory Visit
Admission: RE | Admit: 2011-02-19 | Discharge: 2011-02-19 | Disposition: A | Discharge status: Home / Self Care | Payer: Self-pay | Source: Ambulatory Visit | Attending: Family Medicine | Admitting: Family Medicine

## 2011-02-19 DIAGNOSIS — Z780 Asymptomatic menopausal state: Secondary | ICD-10-CM

## 2011-02-19 DIAGNOSIS — Z1231 Encounter for screening mammogram for malignant neoplasm of breast: Secondary | ICD-10-CM

## 2011-02-19 DIAGNOSIS — M549 Dorsalgia, unspecified: Secondary | ICD-10-CM

## 2011-02-19 DIAGNOSIS — G8929 Other chronic pain: Secondary | ICD-10-CM

## 2011-02-27 ENCOUNTER — Encounter: Payer: Self-pay | Admitting: Family Medicine

## 2011-03-07 ENCOUNTER — Telehealth: Payer: Self-pay | Admitting: Family Medicine

## 2011-03-07 DIAGNOSIS — Z973 Presence of spectacles and contact lenses: Secondary | ICD-10-CM

## 2011-03-07 NOTE — Telephone Encounter (Signed)
Will ask pcp to place referral for opthalmology.Laureen Ochs, Viann Shove

## 2011-03-07 NOTE — Telephone Encounter (Signed)
Pt has an orange card and wants to be referred to an eye doctor, needs new glasses

## 2011-03-07 NOTE — Telephone Encounter (Signed)
Referral ordered.  Please call patient with appointment time/date.  THANKS.

## 2011-03-29 ENCOUNTER — Other Ambulatory Visit: Payer: Self-pay | Admitting: Family Medicine

## 2011-03-29 NOTE — Telephone Encounter (Signed)
Set up appointment for 1/2 @ 2:30pm for patient. Patient has been notified of this while on the phone with me.Lisa Nolan

## 2011-03-29 NOTE — Telephone Encounter (Signed)
Lisa Nolan needs a refill on her Oxycodone.

## 2011-04-04 ENCOUNTER — Ambulatory Visit (INDEPENDENT_AMBULATORY_CARE_PROVIDER_SITE_OTHER): Payer: Self-pay | Admitting: Family Medicine

## 2011-04-04 ENCOUNTER — Encounter: Payer: Self-pay | Admitting: Family Medicine

## 2011-04-04 VITALS — BP 138/80 | HR 69 | Temp 98.1°F | Ht 63.0 in | Wt 160.0 lb

## 2011-04-04 DIAGNOSIS — M549 Dorsalgia, unspecified: Secondary | ICD-10-CM

## 2011-04-04 MED ORDER — OXYCODONE HCL 10 MG PO TABS: 10.0000 mg | ORAL_TABLET | Freq: Three times a day (TID) | ORAL | Status: DC | PRN

## 2011-04-04 NOTE — Assessment & Plan Note (Signed)
Will refill oxycodone 10 mg for 3 months. Patient hopes to have back surgery once her hepatitis C study is over. Will continue to check repeat back pain with oxycodone until then.

## 2011-04-04 NOTE — Progress Notes (Signed)
  Subjective:    Patient ID: Lisa Nolan, female    DOB: [birth year], 56 y.o.   MRN: [id number]  HPI Patient presents to clinic for refill oxycodone for chronic low back pain. Patient is currently in a trial for hepatitis C treatment and cannot take Tylenol for pain. Patient plans to undergo back surgery once liver issue has resolved. Patient will be in trial for about 8 more months. Patient comes in every 3 months for refills. She has not asked for early refills or increase in doses. Complains of constipation, but takes Metamucil and eat a high-fiber diet.   Review of Systems Denies fever, chills, nausea or vomiting, chest pain, difficulty breathing.    Objective:   Physical Exam  Gen.: Pleasant, cooperative, in no distress Heart: Regular rate rhythm Lungs: Clear to auscultation bilaterally Back: Tenderness to palpation lumbar paraspinal muscles       Assessment & Plan:

## 2011-04-04 NOTE — Patient Instructions (Signed)
Please schedule follow up appointment in 3 months. Increase fiber in diet and continue Metamucil. Have a happy new year.

## 2011-05-08 ENCOUNTER — Telehealth: Payer: Self-pay | Admitting: Family Medicine

## 2011-05-08 NOTE — Telephone Encounter (Signed)
[employer] form placed in Dr. Sherron Flemings Cruz's box for signature.  Lisa Nolan

## 2011-05-08 NOTE — Telephone Encounter (Signed)
Patient dropped off form to be filled out concerning medication.  Please fax when completed.

## 2011-05-17 NOTE — Telephone Encounter (Signed)
[employer] form faxed to [phone].  Ileana Ladd

## 2011-06-25 ENCOUNTER — Other Ambulatory Visit: Payer: Self-pay | Admitting: Family Medicine

## 2011-06-25 NOTE — Telephone Encounter (Signed)
Patient is calling needing a refill on her Oxycodone.  She was not sure if it was time for her to be seen.  If not, then please call her when the RX is ready to be picked up.

## 2011-06-27 MED ORDER — OXYCODONE HCL 10 MG PO TABS: 10.0000 mg | ORAL_TABLET | Freq: Three times a day (TID) | ORAL | Status: DC | PRN

## 2011-06-27 NOTE — Telephone Encounter (Signed)
Please let patient know I will be in clinic this afternoon, so she can pick up the Rx anytime after 2:30 PM.  Thank you.

## 2011-06-27 NOTE — Telephone Encounter (Signed)
Addended by: Tye Savoy, Aleiyah Halpin on: 06/27/2011 01:36 PM   Modules accepted: Orders

## 2011-06-27 NOTE — Telephone Encounter (Signed)
Spoke with patient and informed her of below 

## 2011-06-27 NOTE — Telephone Encounter (Signed)
LVM for patient to call back to inform of below 

## 2011-08-07 ENCOUNTER — Telehealth: Payer: Self-pay | Admitting: Family Medicine

## 2011-08-07 NOTE — Telephone Encounter (Signed)
Patient received her last refill on oxycodone April 5. The RX you gave on 03/27 stated not to fill until 04/03 So looks like she should be out now.

## 2011-08-07 NOTE — Telephone Encounter (Signed)
Message forwarded to MD.

## 2011-08-07 NOTE — Telephone Encounter (Signed)
Pt is almost out of her oxycodone and needs enough refill to last her until her appt on 5/21

## 2011-08-07 NOTE — Telephone Encounter (Signed)
Hi Larita Fife, can you find out why patient ran out of Oxycodone?  Does the patient to be seen by PCP if requesting narcotic refills early?

## 2011-08-07 NOTE — Telephone Encounter (Signed)
I will be post-call tomorrow and will try to my best to drop off Rx at front desk before I go home at noon.  Will let you know tomorrow when I have done this.

## 2011-08-08 ENCOUNTER — Telehealth: Payer: Self-pay | Admitting: Family Medicine

## 2011-08-08 NOTE — Telephone Encounter (Signed)
Spoke with pt and told her that the Rx would be ready tomorrow after 2pm. Pt voiced understanding and agreed.Loralee Pacas [place]

## 2011-08-08 NOTE — Telephone Encounter (Signed)
Hi Lisa Nolan, I will not be able to come to clinic today.  Please inform patient that she can pick up Rx Oxycodone tomorrow (Thursday) anytime after 2 PM.  Thanks.

## 2011-08-09 MED ORDER — OXYCODONE HCL 10 MG PO TABS: 10.0000 mg | ORAL_TABLET | Freq: Three times a day (TID) | ORAL | Status: DC | PRN

## 2011-08-09 NOTE — Telephone Encounter (Signed)
Addended by: Tye Savoy, Sila Sarsfield on: 08/09/2011 01:08 PM   Modules accepted: Orders

## 2011-08-09 NOTE — Telephone Encounter (Signed)
Addended by: Tye Savoy, Alcus Bradly on: 08/09/2011 01:04 PM   Modules accepted: Orders

## 2011-08-09 NOTE — Telephone Encounter (Signed)
Lisa Nolan is at lunch.  I will leave Rx at front desk for pick up and route this message to her.  Lisa Nolan, can I send a Rx for narcotics to her home address?

## 2011-08-09 NOTE — Telephone Encounter (Signed)
Consulted with Dr. Deirdre Priest and he advises that RX cannot be mailed patient will need to pick up. . Patient notified.

## 2011-08-09 NOTE — Telephone Encounter (Signed)
Pt is asking that the script be mailed to her.  She has tried everyone she knows and can't find anyone to bring her to get it.  Address has been verified.

## 2011-08-09 NOTE — Telephone Encounter (Signed)
Addended by: Tye Savoy, Hanzel Pizzo on: 08/09/2011 01:02 PM   Modules accepted: Orders

## 2011-08-21 ENCOUNTER — Encounter: Payer: Self-pay | Admitting: Family Medicine

## 2011-08-21 ENCOUNTER — Ambulatory Visit (INDEPENDENT_AMBULATORY_CARE_PROVIDER_SITE_OTHER): Payer: Self-pay | Admitting: Family Medicine

## 2011-08-21 VITALS — BP 132/82 | HR 84 | Temp 98.1°F | Ht 63.0 in | Wt 160.5 lb

## 2011-08-21 DIAGNOSIS — B1710 Acute hepatitis C without hepatic coma: Secondary | ICD-10-CM

## 2011-08-21 DIAGNOSIS — G8929 Other chronic pain: Secondary | ICD-10-CM

## 2011-08-21 DIAGNOSIS — M549 Dorsalgia, unspecified: Secondary | ICD-10-CM

## 2011-08-21 MED ORDER — OXYCODONE HCL 10 MG PO TABS: 10.0000 mg | ORAL_TABLET | Freq: Three times a day (TID) | ORAL | Status: DC | PRN

## 2011-08-21 NOTE — Assessment & Plan Note (Signed)
Patient's chronic pain has been controlled by Oxycodone because she unable to tolerate Tylenol at the time of Hep C treatments.  She is now interested in weaning off narcotics and interested in Pain Clinic. I have placed order for referral to Pain Clinic if any clinics here accept [facility]. Will continue to refill 3 month supply of Oxycodone until seen by Pain Management.
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
## 2011-08-21 NOTE — Patient Instructions (Signed)
It was great to see you today. I will place a referral for DEXA scan for osteoporosis and for Pain Management Clinic. We will call you with time and date of appointments. Please schedule follow up appointment with Dr. Tye Savoy in 3 months. Have a great summer, Dulse!

## 2011-08-21 NOTE — Assessment & Plan Note (Signed)
Patient has completed treatment/trial about 3 months ago.  Follow up blood work every 3-4 months for 2 years.  If liver enzymes are ever elevated, patient to call Liver specialist at [facility] for appointment.

## 2011-10-22 ENCOUNTER — Telehealth: Payer: Self-pay | Admitting: Family Medicine

## 2011-10-22 MED ORDER — OXYCODONE HCL 10 MG PO TABS: 10.0000 mg | ORAL_TABLET | Freq: Three times a day (TID) | ORAL | Status: DC | PRN

## 2011-10-22 NOTE — Telephone Encounter (Signed)
Spoke with patient and informed her of below. She understands that Dr Tommi Rumps Lawson Radar last day in clinic this month is the 16th. She is more than welcome to come in then and have it filled and written on it that it is not able ton be filled until the 22nd. She also understands that Dr Sherron Flemings Sondra Come is the "ONLY" doctor that can write this script. Patient says that she will call back and get this script in September.Lisa Nolan

## 2011-10-22 NOTE — Telephone Encounter (Signed)
Patient can pick up today.  Will give one month supply.  Patient will need an appointment with me before next refill.

## 2011-10-22 NOTE — Telephone Encounter (Signed)
Patient calling regarding her rx for oxycodone.  She will pick up today or tomorrow.  Please call when ready

## 2011-11-21 ENCOUNTER — Telehealth: Payer: Self-pay | Admitting: Family Medicine

## 2011-11-21 NOTE — Telephone Encounter (Signed)
**Note Lisa-Identified via Obfuscation** Disability paperwork for Lisa Nolan needs to completed as soon as possible. Lisa Nolan is out of the office. Placed in Dr. Bluford [place]mann [place]'s box for possible completion. Patient states forms are time sensitive.

## 2011-11-22 ENCOUNTER — Other Ambulatory Visit: Payer: Self-pay | Admitting: Family Medicine

## 2011-11-22 MED ORDER — LIDOCAINE 5 % EX PTCH: 1.0000 | MEDICATED_PATCH | CUTANEOUS | Status: DC

## 2011-11-22 NOTE — Telephone Encounter (Signed)
Forms mailed to patient in self stamped address envelope enclosed with forms.  Lisa Nolan

## 2011-11-22 NOTE — Telephone Encounter (Signed)
Requesting permanent disability for chronic back pain Discussed with Dr. Tye Savoy who thinks it is appropriate to approve Filled out form and given to [facility]

## 2011-12-04 ENCOUNTER — Encounter: Payer: Self-pay | Admitting: Family Medicine

## 2011-12-04 ENCOUNTER — Ambulatory Visit (INDEPENDENT_AMBULATORY_CARE_PROVIDER_SITE_OTHER): Payer: Self-pay | Admitting: Family Medicine

## 2011-12-04 VITALS — BP 129/88 | HR 71 | Ht 62.0 in | Wt 165.0 lb

## 2011-12-04 DIAGNOSIS — Z720 Tobacco use: Secondary | ICD-10-CM | POA: Insufficient documentation

## 2011-12-04 DIAGNOSIS — F17200 Nicotine dependence, unspecified, uncomplicated: Secondary | ICD-10-CM

## 2011-12-04 DIAGNOSIS — M549 Dorsalgia, unspecified: Secondary | ICD-10-CM

## 2011-12-04 MED ORDER — OXYCODONE HCL 5 MG PO CAPS: 5.0000 mg | ORAL_CAPSULE | Freq: Two times a day (BID) | ORAL | Status: DC | PRN
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
## 2011-12-04 NOTE — Assessment & Plan Note (Signed)
Patient not a candidate to be seen at Pain Clinic.  Will try to wean off narcotics. Patient has cut back from Oxycodone 10 TID to one to two tablets per day. This month, continue to to take it twice per day as needed for pain. Next month, gave Rx for Oxycodone 5 BID PRN pain. Rx handed to patient. Goal is to wean off to Tramadol if safe in patient with Hepatitis C.  Will discuss with pharmacy. Follow up in 2 months.

## 2011-12-04 NOTE — Assessment & Plan Note (Signed)
Advised patient to schedule appointment with Pharmacy Clinic to discuss smoking cessation.

## 2012-02-22 ENCOUNTER — Encounter: Payer: Self-pay | Admitting: Family Medicine

## 2012-02-22 ENCOUNTER — Ambulatory Visit (INDEPENDENT_AMBULATORY_CARE_PROVIDER_SITE_OTHER): Payer: Self-pay | Admitting: Family Medicine

## 2012-02-22 VITALS — BP 138/77 | HR 74 | Temp 98.9°F | Ht 62.0 in | Wt 169.2 lb

## 2012-02-22 DIAGNOSIS — M549 Dorsalgia, unspecified: Secondary | ICD-10-CM

## 2012-02-22 MED ORDER — OXYCODONE HCL 5 MG PO CAPS: 5.0000 mg | ORAL_CAPSULE | Freq: Two times a day (BID) | ORAL | Status: DC | PRN

## 2012-02-22 MED ORDER — TRAZODONE HCL 50 MG PO TABS: 50.0000 mg | ORAL_TABLET | Freq: Every evening | ORAL | Status: DC | PRN
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
## 2012-02-22 NOTE — Assessment & Plan Note (Signed)
Patient here for medication refills - Oxycodone 5 mg.  Patient used to take 10 mg and is now weaning off high dose narcotics.  Last refill was 01/06/12. - Will give 30 day supply of Oxycodone 5 mg BID PRN - Add Trazodone QHS PRN  - Once patient receives Medicaid, will refer her to a Pain Clinic to help with narcotic taper - Follow up as needed - Red flags reviewed with patient and per AVS

## 2012-02-22 NOTE — Patient Instructions (Addendum)
It was great to see you today, Lisa Nolan. Please take Trazodone 1 tablet at bedtime as needed for insomnia. When you get your Medicaid card, please schedule a follow up appointment with me. If you develop worsening back pain associated with loss of bowel or bladder function, please call your doctor or go to ER.  Chronic Back Pain  When back pain lasts longer than 3 months, it is called chronic back pain.People with chronic back pain often go through certain periods that are more intense (flare-ups).  CAUSES Chronic back pain can be caused by wear and tear (degeneration) on different structures in your back. These structures include:  The bones of your spine (vertebrae) and the joints surrounding your spinal cord and nerve roots (facets).  The strong, fibrous tissues that connect your vertebrae (ligaments). Degeneration of these structures may result in pressure on your nerves. This can lead to constant pain. HOME CARE INSTRUCTIONS  Avoid bending, heavy lifting, prolonged sitting, and activities which make the problem worse.  Take brief periods of rest throughout the day to reduce your pain. Lying down or standing usually is better than sitting while you are resting.  Take over-the-counter or prescription medicines only as directed by your caregiver. SEEK IMMEDIATE MEDICAL CARE IF:   You have weakness or numbness in one of your legs or feet.  You have trouble controlling your bladder or bowels.  You have nausea, vomiting, abdominal pain, shortness of breath, or fainting. Document Released: 04/26/2004 Document Revised: 06/11/2011 Document Reviewed: 03/03/2011 [facility] Patient Information 2013 [name], [place].

## 2012-03-12 ENCOUNTER — Other Ambulatory Visit: Payer: Self-pay | Admitting: Family Medicine

## 2012-03-12 DIAGNOSIS — Z1231 Encounter for screening mammogram for malignant neoplasm of breast: Secondary | ICD-10-CM

## 2012-04-21 ENCOUNTER — Encounter: Payer: Self-pay | Admitting: Family Medicine

## 2012-04-21 ENCOUNTER — Ambulatory Visit (INDEPENDENT_AMBULATORY_CARE_PROVIDER_SITE_OTHER): Payer: No Typology Code available for payment source | Admitting: Family Medicine

## 2012-04-21 VITALS — BP 145/80 | HR 80 | Temp 98.1°F | Wt 169.1 lb

## 2012-04-21 DIAGNOSIS — Z23 Encounter for immunization: Secondary | ICD-10-CM

## 2012-04-21 DIAGNOSIS — J019 Acute sinusitis, unspecified: Secondary | ICD-10-CM

## 2012-04-21 DIAGNOSIS — M549 Dorsalgia, unspecified: Secondary | ICD-10-CM

## 2012-04-21 MED ORDER — AMOXICILLIN 500 MG PO CAPS: 500.0000 mg | ORAL_CAPSULE | Freq: Two times a day (BID) | ORAL | Status: AC

## 2012-04-21 MED ORDER — OXYCODONE HCL 5 MG PO CAPS: 5.0000 mg | ORAL_CAPSULE | Freq: Two times a day (BID) | ORAL | Status: DC | PRN

## 2012-04-21 MED ORDER — GUAIFENESIN 100 MG/5ML PO LIQD: 200.0000 mg | Freq: Three times a day (TID) | ORAL | Status: DC | PRN

## 2012-04-21 NOTE — Patient Instructions (Addendum)
It was good to see you today, Lisa Nolan. Please pick up Rx from [facility] and use as directed. Please call me when you have Medicaid so we can start the referral process. If you develop persistent fevers associated with nausea or vomiting, please call your doctor. If symptoms do not improve within 2 weeks, please return to clinic. Have a happy new year, Lisa Nolan.   Sinusitis Sinusitis an infection of the air pockets (sinuses) in your face. This can cause puffiness (swelling). It can also cause drainage from your sinuses.  HOME CARE   Only take medicine as told by your doctor.  Drink enough fluids to keep your pee (urine) clear or pale yellow.  Apply moist heat or ice packs for pain relief.  Use salt (saline) nose sprays. The spray will wet the thick fluid in the nose. This can help the sinuses drain. GET HELP RIGHT AWAY IF:   You have a fever.  Your baby is older than 3 months with a rectal temperature of 102 F (38.9 C) or higher.  Your baby is 21 months old or younger with a rectal temperature of 100.4 F (38 C) or higher.  The pain gets worse.  You get a very bad headache.  You keep throwing up (vomiting).  Your face gets puffy. MAKE SURE YOU:   Understand these instructions.  Will watch your condition.  Will get help right away if you are not doing well or get worse. Document Released: 09/05/2007 Document Revised: 06/11/2011 Document Reviewed: 09/05/2007 [facility] Patient Information 2013 [place], [place].

## 2012-04-21 NOTE — Progress Notes (Signed)
  Subjective:    Patient ID: Lisa Nolan, female    DOB: 05-Mar-1956, 57 y.o.   MRN: [id number]  HPI  Patient presents to clinic for productive cough (yellow sputum) and chest congestion.  Started 2 weeks ago; patient took old, expired Rx for Amoxicillin 2 tablets and Tussinex last week.  She says symptoms improved at first after taking old antibiotics and Tussinex, but symptoms came back.  Associated symptoms: runny nose, mild sore throat but this is getting better.  No headache.  Denies any dysuria, diarrhea/constipation, nausea/vomiting.  She is drinking plenty of fluids and eating well.  Of note, patient needs refills for Oxycodone.  She hopes to wean off medications, but waiting for Medicaid so we can send her to Pain Clinic.  She takes Oxy IR 5 mg 1-2 tablets every other day.  She used to take Oxy 10 but pain well controlled with Oxy 5.  She is trying to increase physical activity as well.  Review of Systems  Per HPI    Objective:   Physical Exam  Constitutional: No distress.  HENT:  Head: Normocephalic and atraumatic.  Mouth/Throat: Oropharynx is clear and moist.       congested  Cardiovascular: Normal rate, regular rhythm and normal heart sounds.   Pulmonary/Chest: Effort normal and breath sounds normal. She has no wheezes. She has no rales.  Musculoskeletal:       Back: no skin changes, no bony tenderness, + TTP paraspinal, limited ROM with flexion, normal ROM extension, normal gait      Assessment & Plan:

## 2012-04-21 NOTE — Assessment & Plan Note (Addendum)
Will refill Oxy IR 5 mg today for 1 month.  She should be getting Medicaid in February and I will refer patient to Pain Clinic to help with tapering process.  No red flags on exam today.

## 2012-04-21 NOTE — Assessment & Plan Note (Signed)
Acute sinusitis lasting longer than 7 days.  Will treat with Amoxicillin x 7 days.  For cough, Robitussin sent to pharmacy.  RTC if symptoms worsen.

## 2012-05-27 ENCOUNTER — Ambulatory Visit: Payer: Self-pay

## 2012-06-18 ENCOUNTER — Encounter: Payer: Self-pay | Admitting: Family Medicine

## 2012-06-18 ENCOUNTER — Ambulatory Visit (INDEPENDENT_AMBULATORY_CARE_PROVIDER_SITE_OTHER): Payer: Medicare Other | Admitting: Family Medicine

## 2012-06-18 VITALS — BP 151/78 | HR 81 | Temp 98.7°F | Ht 62.0 in | Wt 169.7 lb

## 2012-06-18 DIAGNOSIS — Z1211 Encounter for screening for malignant neoplasm of colon: Secondary | ICD-10-CM

## 2012-06-18 DIAGNOSIS — Z1231 Encounter for screening mammogram for malignant neoplasm of breast: Secondary | ICD-10-CM

## 2012-06-18 DIAGNOSIS — H9190 Unspecified hearing loss, unspecified ear: Secondary | ICD-10-CM

## 2012-06-18 DIAGNOSIS — M549 Dorsalgia, unspecified: Secondary | ICD-10-CM

## 2012-06-18 DIAGNOSIS — H9193 Unspecified hearing loss, bilateral: Secondary | ICD-10-CM

## 2012-06-18 DIAGNOSIS — Z1239 Encounter for other screening for malignant neoplasm of breast: Secondary | ICD-10-CM

## 2012-06-18 MED ORDER — TRAMADOL HCL 50 MG PO TABS: 50.0000 mg | ORAL_TABLET | Freq: Three times a day (TID) | ORAL | Status: DC | PRN

## 2012-06-18 MED ORDER — OXYCODONE HCL 5 MG PO CAPS: 5.0000 mg | ORAL_CAPSULE | Freq: Two times a day (BID) | ORAL | Status: DC | PRN

## 2012-06-18 NOTE — Patient Instructions (Addendum)
It was nice to see you today, Lisa Nolan, For a mammogram, please call the GI center and schedule an appointment. We will call you for Audiology and colonoscopy appointments. Take Tramadol every 8 hours as needed for low back pain.  Watch for drowsiness. Schedule follow up appointment with me as needed.
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
## 2012-06-24 DIAGNOSIS — H9190 Unspecified hearing loss, unspecified ear: Secondary | ICD-10-CM | POA: Insufficient documentation

## 2012-06-24 NOTE — Assessment & Plan Note (Signed)
Will refill Oxy IR 5 mg today for 1 month.  She should be getting Medicaid and I will refer patient to Pain Clinic to help with tapering process.  No red flags on exam today.

## 2012-06-24 NOTE — Assessment & Plan Note (Signed)
Normal ear exam without signs of infection or impaction.  Will send to Audiology for formal hearing test.  Follow up as needed.

## 2012-07-08 ENCOUNTER — Ambulatory Visit: Payer: Medicare Other | Attending: Family Medicine | Admitting: Audiology

## 2012-07-08 DIAGNOSIS — H93299 Other abnormal auditory perceptions, unspecified ear: Secondary | ICD-10-CM | POA: Insufficient documentation

## 2012-07-18 ENCOUNTER — Ambulatory Visit
Admission: RE | Admit: 2012-07-18 | Discharge: 2012-07-18 | Disposition: A | Discharge status: Home / Self Care | Payer: Medicare Other | Source: Ambulatory Visit | Attending: Family Medicine | Admitting: Family Medicine

## 2012-07-18 DIAGNOSIS — Z1231 Encounter for screening mammogram for malignant neoplasm of breast: Secondary | ICD-10-CM

## 2012-07-21 ENCOUNTER — Other Ambulatory Visit: Payer: Self-pay | Admitting: Family Medicine

## 2012-07-21 DIAGNOSIS — R928 Other abnormal and inconclusive findings on diagnostic imaging of breast: Secondary | ICD-10-CM

## 2012-07-23 ENCOUNTER — Ambulatory Visit: Payer: Self-pay | Admitting: Family Medicine

## 2012-07-23 ENCOUNTER — Encounter: Payer: Self-pay | Admitting: Family Medicine

## 2012-07-25 ENCOUNTER — Ambulatory Visit (INDEPENDENT_AMBULATORY_CARE_PROVIDER_SITE_OTHER): Payer: Medicare Other | Admitting: Pharmacist

## 2012-07-25 ENCOUNTER — Encounter: Payer: Self-pay | Admitting: Family Medicine

## 2012-07-25 ENCOUNTER — Encounter: Payer: Self-pay | Admitting: Pharmacist

## 2012-07-25 ENCOUNTER — Ambulatory Visit (INDEPENDENT_AMBULATORY_CARE_PROVIDER_SITE_OTHER): Payer: Medicare Other | Admitting: Family Medicine

## 2012-07-25 ENCOUNTER — Other Ambulatory Visit (HOSPITAL_COMMUNITY)
Admission: RE | Admit: 2012-07-25 | Discharge: 2012-07-25 | Disposition: A | Discharge status: Home / Self Care | Payer: Medicare Other | Source: Ambulatory Visit | Attending: Family Medicine | Admitting: Family Medicine

## 2012-07-25 VITALS — BP 124/77 | HR 73 | Temp 98.4°F | Ht 62.0 in | Wt 171.2 lb

## 2012-07-25 DIAGNOSIS — Z124 Encounter for screening for malignant neoplasm of cervix: Secondary | ICD-10-CM

## 2012-07-25 DIAGNOSIS — Z01419 Encounter for gynecological examination (general) (routine) without abnormal findings: Secondary | ICD-10-CM | POA: Insufficient documentation

## 2012-07-25 DIAGNOSIS — E669 Obesity, unspecified: Secondary | ICD-10-CM

## 2012-07-25 DIAGNOSIS — M549 Dorsalgia, unspecified: Secondary | ICD-10-CM

## 2012-07-25 DIAGNOSIS — F17200 Nicotine dependence, unspecified, uncomplicated: Secondary | ICD-10-CM

## 2012-07-25 DIAGNOSIS — Z0000 Encounter for general adult medical examination without abnormal findings: Secondary | ICD-10-CM

## 2012-07-25 DIAGNOSIS — Z720 Tobacco use: Secondary | ICD-10-CM

## 2012-07-25 DIAGNOSIS — F329 Major depressive disorder, single episode, unspecified: Secondary | ICD-10-CM

## 2012-07-25 DIAGNOSIS — M545 Low back pain: Secondary | ICD-10-CM

## 2012-07-25 DIAGNOSIS — Z1151 Encounter for screening for human papillomavirus (HPV): Secondary | ICD-10-CM | POA: Insufficient documentation

## 2012-07-25 LAB — CBC
HCT: 42 % (ref 36.0–46.0)
MCH: 30.7 pg (ref 26.0–34.0)
MCHC: 34.5 g/dL (ref 30.0–36.0)
MCV: 89 fL (ref 78.0–100.0)
Platelets: 225 10*3/uL (ref 150–400)
RDW: 13.5 % (ref 11.5–15.5)
WBC: 9.3 10*3/uL (ref 4.0–10.5)

## 2012-07-25 LAB — COMPREHENSIVE METABOLIC PANEL
Alkaline Phosphatase: 98 U/L (ref 39–117)
BUN: 13 mg/dL (ref 6–23)
CO2: 28 mEq/L (ref 19–32)
Glucose, Bld: 106 mg/dL — ABNORMAL HIGH (ref 70–99)
Potassium: 4.1 mEq/L (ref 3.5–5.3)
Sodium: 140 mEq/L (ref 135–145)
Total Protein: 7.6 g/dL (ref 6.0–8.3)

## 2012-07-25 LAB — LIPID PANEL
Cholesterol: 244 mg/dL — ABNORMAL HIGH (ref 0–200)
Triglycerides: 376 mg/dL — ABNORMAL HIGH (ref <150)
VLDL: 75 mg/dL — ABNORMAL HIGH (ref 0–40)

## 2012-07-25 MED ORDER — OXYCODONE HCL 5 MG PO CAPS: 5.0000 mg | ORAL_CAPSULE | Freq: Two times a day (BID) | ORAL | Status: DC | PRN

## 2012-07-25 MED ORDER — IBUPROFEN 600 MG PO TABS: 600.0000 mg | ORAL_TABLET | Freq: Three times a day (TID) | ORAL | Status: DC | PRN

## 2012-07-25 NOTE — Patient Instructions (Addendum)
It was good to see you today. For chronic pain, take oxycodone and ibuprofen as needed.  Pain clinic referral in process. We will call you with time and date of colonoscopy. We will call or send a letter with recent lab work and studies. Schedule next annual physical in one year.

## 2012-07-25 NOTE — Progress Notes (Signed)
  Subjective:     Lisa Nolan is a 57 y.o. female and is here for a comprehensive physical exam. The patient reports no problems.  She hopes to quit smoking and has an appointment with Dr. Raymondo Band today.  She continues to take oxycodone 5 mg for chronic low back pain, but she only takes when needed and does not ask for increase in dosage or frequency.  History   Social History  . Marital Status: Single   Social History Main Topics  . Smoking status: Current Every Day Smoker -- 0.50 packs/day for 40 years    Types: Cigarettes  . Smokeless tobacco: Current User  . Alcohol Use: 0.5 oz/week    1 drink(s) per week  . Drug Use: No  . Sexually Active: Yes -- Female partner(s)   Social History Narrative   Lives in [place] with husband.   Health Maintenance  Topic Date Due  . Pap Smear  11/12/1972  . Colonoscopy  11/12/2004  . Influenza Vaccine  12/01/2012  . Mammogram  07/19/2014  . Tetanus/tdap  11/06/2020   The following portions of the patient's history were reviewed and updated as appropriate: allergies, current medications, past medical history and past surgical history.  Review of Systems Pertinent items are noted in HPI.   Objective:    BP 124/77  Pulse 73  Temp(Src) 98.4 F (36.9 C) (Oral)  Ht 5\' 2"  (1.575 m)  Wt 171 lb 3.2 oz (77.656 kg)  BMI 31.31 kg/m2 General appearance: alert, cooperative and no distress Lungs: clear to auscultation bilaterally Heart: regular rate and rhythm, S1, S2 normal, no murmur, click, rub or gallop Abdomen: soft, non-tender; bowel sounds normal; no masses,  no organomegaly Extremities: extremities normal, atraumatic, no cyanosis or edema Skin: Skin color, texture, turgor normal. No rashes or lesions Neurologic: Grossly normal  Pelvic: cervix and uterus normal; no masses; no bleeding or discharge  Assessment:    Healthy female exam with pap smear.     Plan:

## 2012-07-25 NOTE — Assessment & Plan Note (Signed)
Perform pap today and notify of results.  Screening labs also ordered today.

## 2012-07-25 NOTE — Assessment & Plan Note (Signed)
Moderate Nicotine Dependence of 40 years duration in a patient who is good candidate for success b/c of motivation to quit.    Initiated nicotine replacement tx of lozenges. Recommended that she get the 4 mg lozenges. Patient counseled on purpose, proper use, and potential adverse effects, including   Written information provided.  F/U Rx Clinic Visit at the end of May.    Total time in face-to-face counseling 25 minutes.  Patient seen with Abran Duke, PharmD Resident and Juanita Craver, PharmD candidate.

## 2012-07-25 NOTE — Progress Notes (Signed)
  Subjective:    Patient ID: Lisa Nolan, female    DOB: 01-10-1956, 57 y.o.   MRN: [id number]  HPI Patient arrives in good spirits for smoking cessation counseling.  She states that she is embarrassed that she smokes. She also wants to quit for her mother.   Goal quit date is two weeks before she visits mother.   Age when started using tobacco on a daily basis 16. Number of Cigarettes per day 15. Max 1 ppd. Brand smoked Camel. Estimated Nicotine Content per Cigarette (mg) 1.2.  Estimated Nicotine intake per day 20 mg.   Smokes first cigarette <30 minutes after waking. Wakes up 1-2 times per month to smoke at night.  Estimated Fagerstrom Score 5/10.  Most recent quit attempt two years ago Longest time ever been tobacco free a week. What Medications (NRT, bupropion, varenicline) used in past includes varenicline, which made her feel depressed.  Rates MOTIVATION of quitting tobacco on 1-10 scale of 8. Rates READINESS of quitting tobacco on 1-10 scale of 9. Rates CONFIDENCE of quitting tobacco on 1-10 scale of 9. Triggers to use tobacco include; stress, after meals   Review of Systems     Objective:   Physical Exam        Assessment & Plan:  Moderate Nicotine Dependence of 40 years duration in a patient who is good candidate for success b/c of motivation to quit.    Initiated nicotine replacement tx of lozenges. Recommended that she get the 4 mg lozenges. Patient counseled on purpose, proper use, and potential adverse effects, including   Written information provided.  F/U Rx Clinic Visit at the end of May.    Total time in face-to-face counseling 25 minutes.  Patient seen with Abran Duke, PharmD Resident and Juanita Craver, PharmD candidate. Marland Kitchen

## 2012-07-25 NOTE — Assessment & Plan Note (Signed)
Referral for pain clinic ordered to help wean patient off of chronic narcotics.  She was prescribed this for back pain while being treated for Hep C.   Tramadol makes patient sick, so will continue oxycodone and start Motrin PRN.  Follow up with me in 1-2 months.

## 2012-07-25 NOTE — Patient Instructions (Addendum)
It was nice to meet you today.  Your plan is to stop smoking tomorrow and to start using the lozenges.  Get the 4 mg lozenges (whichever brand and flavor you prefer). Use ever 1.5-2 hours.   You can do it!! Keep busy with your pets and your garden.  Return to see Dr. Raymondo Band after May 21, when you return from [place].

## 2012-07-28 ENCOUNTER — Telehealth: Payer: Self-pay | Admitting: Family Medicine

## 2012-07-28 ENCOUNTER — Encounter: Payer: Self-pay | Admitting: Family Medicine

## 2012-07-28 NOTE — Telephone Encounter (Signed)
Spoke to patient about elevated TG.  She has a hx of hepatitis C status post therapy, not active disease.  Patient says she eats a lot of desserts and fatty foods and will try to avoid them from now on.  She is undergoing several tests for a breast mass and does not want to take any new medications at this time.  She does take fish oil pills.  I told to patient that we would discuss this further at next visit after breast mass work up is over.

## 2012-07-28 NOTE — Progress Notes (Signed)
Patient ID: Lisa Nolan, female   DOB: 1955/07/14, 57 y.o.   MRN: [id number] Reviewed: Agree with Dr. Macky Lower documentation and management.

## 2012-07-30 ENCOUNTER — Ambulatory Visit
Admission: RE | Admit: 2012-07-30 | Discharge: 2012-07-30 | Disposition: A | Discharge status: Home / Self Care | Payer: Medicare Other | Source: Ambulatory Visit | Attending: Family Medicine | Admitting: Family Medicine

## 2012-07-30 ENCOUNTER — Encounter: Payer: Self-pay | Admitting: Physical Medicine & Rehabilitation

## 2012-07-30 DIAGNOSIS — R928 Other abnormal and inconclusive findings on diagnostic imaging of breast: Secondary | ICD-10-CM

## 2012-08-01 ENCOUNTER — Encounter: Payer: Self-pay | Admitting: Internal Medicine

## 2012-08-29 ENCOUNTER — Ambulatory Visit: Payer: Medicare Other | Admitting: Physical Medicine & Rehabilitation

## 2012-09-04 ENCOUNTER — Encounter: Payer: Self-pay | Admitting: Internal Medicine

## 2012-09-04 ENCOUNTER — Ambulatory Visit (AMBULATORY_SURGERY_CENTER): Payer: Medicare Other | Admitting: *Deleted

## 2012-09-04 VITALS — Ht 62.0 in | Wt 174.0 lb

## 2012-09-04 DIAGNOSIS — Z1211 Encounter for screening for malignant neoplasm of colon: Secondary | ICD-10-CM

## 2012-09-04 MED ORDER — NA SULFATE-K SULFATE-MG SULF 17.5-3.13-1.6 GM/177ML PO SOLN: ORAL | Status: DC

## 2012-09-12 ENCOUNTER — Encounter: Payer: Medicare Other | Attending: Physical Medicine & Rehabilitation

## 2012-09-12 ENCOUNTER — Encounter: Payer: Self-pay | Admitting: Physical Medicine & Rehabilitation

## 2012-09-12 ENCOUNTER — Ambulatory Visit (HOSPITAL_BASED_OUTPATIENT_CLINIC_OR_DEPARTMENT_OTHER): Payer: Medicare Other | Admitting: Physical Medicine & Rehabilitation

## 2012-09-12 VITALS — BP 111/73 | HR 80 | Resp 16 | Ht 62.0 in | Wt 173.0 lb

## 2012-09-12 DIAGNOSIS — M47817 Spondylosis without myelopathy or radiculopathy, lumbosacral region: Secondary | ICD-10-CM

## 2012-09-12 DIAGNOSIS — M51379 Other intervertebral disc degeneration, lumbosacral region without mention of lumbar back pain or lower extremity pain: Secondary | ICD-10-CM | POA: Insufficient documentation

## 2012-09-12 DIAGNOSIS — M722 Plantar fascial fibromatosis: Secondary | ICD-10-CM | POA: Insufficient documentation

## 2012-09-12 DIAGNOSIS — M5137 Other intervertebral disc degeneration, lumbosacral region: Secondary | ICD-10-CM | POA: Insufficient documentation

## 2012-09-12 DIAGNOSIS — G8929 Other chronic pain: Secondary | ICD-10-CM | POA: Insufficient documentation

## 2012-09-12 DIAGNOSIS — Z79899 Other long term (current) drug therapy: Secondary | ICD-10-CM | POA: Insufficient documentation

## 2012-09-12 MED ORDER — TRAMADOL HCL 50 MG PO TABS: 50.0000 mg | ORAL_TABLET | Freq: Four times a day (QID) | ORAL | Status: DC | PRN

## 2012-09-12 NOTE — Patient Instructions (Signed)
I do not recommend oxycodone for your condition  You have a combination between some disc problems as well as arthritis of the back.  Your heell problem is plantar fasciitis  I recommend physical therapy  If you have concerns about a facility fee. There are other private pain management practices that may not have a facility feet. We will give you a list.  You'll see me on an as-needed basis

## 2012-09-12 NOTE — Progress Notes (Signed)
Subjective:    Patient ID: Lisa Nolan, female    DOB: 1955-12-21, 57 y.o.   MRN: [id number]  HPI 5 year history of low back pain, some back pain, several falls over the last few years.No back surgery. Seen by PCP  Left heel pain for 3 weeks no trauma.  First thing in morning is worst and when she goes to sleep at night Has seen chiropracter but not had PT. No red flags. As the gained about 5 pounds. Eating more fried foods  Patient is happy that she now has health insurance.   Pain Inventory Average Pain 8 Pain Right Now 6 My pain is sharp, tingling and aching  In the last 24 hours, has pain interfered with the following? General activity 7 Relation with others 5 Enjoyment of life 8 What TIME of day is your pain at its worst? morning,evening,night  Sleep (in general) Fair  Pain is worse with: bending, sitting, inactivity, standing and some activites Pain improves with: pacing activities and medication Relief from Meds: 7  Mobility walk without assistance how many minutes can you walk? 15 ability to climb steps?  yes do you drive?  no Do you have any goals in this area?  yes  Function disabled: date disabled na I need assistance with the following:  dressing and shopping  Neuro/Psych No problems in this area  Prior Studies Any changes since last visit?  no LUMBAR SPINE - COMPLETE 4+ VIEW ,02/17/2009 Comparison: None available.  Findings: Five non-rib bearing lumbar type vertebral bodies are  present. Facet degenerative changes are present bilaterally at L4-  5 and L5-S1, worse on the left. There is loss of disc height and  endplate degenerative change at L5-S1. Endplate degenerative  changes are also present at T11-12.  IMPRESSION:  1. No acute abnormality.  2. Moderate degenerative changes of the lower lumbar spine as  described.  3. Degenerative changes at T11-12.  Physicians involved in your care Any changes since last visit?  yes Primary care Dr.  Lajoyce Corners de la cruz   Family History  Problem Relation Age of Onset  . Colon cancer Maternal Aunt 43   History   Social History  . Marital Status: Single    Spouse Name: N/A    Number of Children: N/A  . Years of Education: N/A   Social History Main Topics  . Smoking status: Current Every Day Smoker -- 0.75 packs/day for 40 years    Types: Cigarettes  . Smokeless tobacco: Never Used  . Alcohol Use: 0.5 oz/week    1 drink(s) per week  . Drug Use: No  . Sexually Active: Yes -- Female partner(s)   Other Topics Concern  . None   Social History Narrative   Lives in [place] with husband.   Past Surgical History  Procedure Laterality Date  . Tonsillectomy  1963   Past Medical History  Diagnosis Date  . Chronic back pain    BP 111/73  Pulse 80  Resp 16  Ht 5\' 2"  (1.575 m)  Wt 173 lb (78.472 kg)  BMI 31.63 kg/m2  SpO2 96%     Review of Systems  All other systems reviewed and are negative.       Objective:   Physical Exam  Nursing note and vitals reviewed. Constitutional: She is oriented to person, place, and time. She appears well-developed and well-nourished.  HENT:  Head: Normocephalic and atraumatic.  Musculoskeletal:       Right hip: Normal.  Left hip: She exhibits bony tenderness. She exhibits normal range of motion and no deformity.       Right ankle: Normal.       Left ankle: Normal.       Cervical back: She exhibits normal range of motion, no tenderness and no swelling.       Lumbar back: She exhibits decreased range of motion, tenderness and pain. She exhibits no swelling, no edema, no deformity and no spasm.       Right foot: Normal.       Left foot: She exhibits bony tenderness. She exhibits normal range of motion, normal capillary refill and no deformity.  Complains of dizziness when looking upward   Neurological: She is alert and oriented to person, place, and time. She has normal reflexes. No cranial nerve deficit. She exhibits normal  muscle tone. Coordination normal. She displays no Babinski's sign on the right side. She displays no Babinski's sign on the left side.  Psychiatric: She has a normal mood and affect.          Assessment & Plan:  1. Lumbar spondylosis and lumbar degenerative disc mild to moderate by radiographic criteria. She does have chronic pain. She has not tried physical therapy. She does get some relief with ibuprofen. I would not recommend oxycodone in this situation she is down to 5 mg twice a day. We can substitute tramadol. I have ordered physical therapy. Patient and her spouse have concerns about facility feet. I've given him a list of other similar specialist in this area that have private office practice.  2. Left plantar fasciitis. Recommend frozen water bottle massage rolling her foot over this. Also recommend heel cord stretching. PT can also help address this.  RTC when necessary if the patient decides to followup with his clinic.

## 2012-09-18 ENCOUNTER — Ambulatory Visit (AMBULATORY_SURGERY_CENTER): Payer: Medicare Other | Admitting: Internal Medicine

## 2012-09-18 ENCOUNTER — Encounter: Payer: Self-pay | Admitting: Internal Medicine

## 2012-09-18 VITALS — BP 115/69 | HR 65 | Temp 98.0°F | Resp 10 | Ht 62.0 in | Wt 174.0 lb

## 2012-09-18 DIAGNOSIS — D126 Benign neoplasm of colon, unspecified: Secondary | ICD-10-CM

## 2012-09-18 DIAGNOSIS — K5730 Diverticulosis of large intestine without perforation or abscess without bleeding: Secondary | ICD-10-CM

## 2012-09-18 DIAGNOSIS — Z1211 Encounter for screening for malignant neoplasm of colon: Secondary | ICD-10-CM

## 2012-09-18 MED ORDER — SODIUM CHLORIDE 0.9 % IV SOLN: 500.0000 mL | INTRAVENOUS | Status: DC

## 2012-09-18 NOTE — Progress Notes (Signed)
Procedure ends, to recovery awke, VSS.

## 2012-09-18 NOTE — Patient Instructions (Addendum)
I found and removed 5 very small polyps. They look benign. You also have diverticulosis - a common condition that is not usually a problem.  I will let you know pathology results and when to have another routine colonoscopy by mail. I appreciate the opportunity to care for you. Iva Boop, MD, FACG   YOU HAD AN ENDOSCOPIC PROCEDURE TODAY AT THE [facility]: Refer to the procedure report that was given to you for any specific questions about what was found during the examination.  If the procedure report does not answer your questions, please call your gastroenterologist to clarify.  If you requested that your care partner not be given the details of your procedure findings, then the procedure report has been included in a sealed envelope for you to review at your convenience later.  YOU SHOULD EXPECT: Some feelings of bloating in the abdomen. Passage of more gas than usual.  Walking can help get rid of the air that was put into your GI tract during the procedure and reduce the bloating. If you had a lower endoscopy (such as a colonoscopy or flexible sigmoidoscopy) you may notice spotting of blood in your stool or on the toilet paper. If you underwent a bowel prep for your procedure, then you may not have a normal bowel movement for a few days.  DIET: Your first meal following the procedure should be a light meal and then it is ok to progress to your normal diet.  A half-sandwich or bowl of soup is an example of a good first meal.  Heavy or fried foods are harder to digest and may make you feel nauseous or bloated.  Likewise meals heavy in dairy and vegetables can cause extra gas to form and this can also increase the bloating.  Drink plenty of fluids but you should avoid alcoholic beverages for 24 hours.  ACTIVITY: Your care partner should take you home directly after the procedure.  You should plan to take it easy, moving slowly for the rest of the day.  You can resume normal  activity the day after the procedure however you should NOT DRIVE or use heavy machinery for 24 hours (because of the sedation medicines used during the test).    SYMPTOMS TO REPORT IMMEDIATELY: A gastroenterologist can be reached at any hour.  During normal business hours, 8:30 AM to 5:00 PM Monday through Friday, call [phone].  After hours and on weekends, please call the GI answering service at [phone] who will take a message and have the physician on call contact you.   Following lower endoscopy (colonoscopy or flexible sigmoidoscopy):  Excessive amounts of blood in the stool  Significant tenderness or worsening of abdominal pains  Swelling of the abdomen that is new, acute  Fever of 100F or higher  FOLLOW UP: If any biopsies were taken you will be contacted by phone or by letter within the next 1-3 weeks.  Call your gastroenterologist if you have not heard about the biopsies in 3 weeks.  Our staff will call the home number listed on your records the next business day following your procedure to check on you and address any questions or concerns that you may have at that time regarding the information given to you following your procedure. This is a courtesy call and so if there is no answer at the home number and we have not heard from you through the emergency physician on call, we will assume that you have returned  to your regular daily activities without incident.  SIGNATURES/CONFIDENTIALITY: You and/or your care partner have signed paperwork which will be entered into your electronic medical record.  These signatures attest to the fact that that the information above on your After Visit Summary has been reviewed and is understood.  Full responsibility of the confidentiality of this discharge information lies with you and/or your care-partner.  Polyp, Diverticulosis-handout given  Repeat colonoscopy will be determined by pathology

## 2012-09-18 NOTE — Progress Notes (Signed)
Called to room to assist during endoscopic procedure.  Patient ID and intended procedure confirmed with present staff. Received instructions for my participation in the procedure from the performing physician. ewm 

## 2012-09-18 NOTE — Progress Notes (Signed)
Patient did not experience any of the following events: a burn prior to discharge; a fall within the facility; wrong site/side/patient/procedure/implant event; or a hospital transfer or hospital admission upon discharge from the facility. (G8907) Patient did not have preoperative order for IV antibiotic SSI prophylaxis. (G8918)  

## 2012-09-18 NOTE — Op Note (Signed)
 [facility] [address] [place], [address]   COLONOSCOPY PROCEDURE REPORT  PATIENT: Lisa Nolan, Lisa Nolan  MR#: [id number] BIRTHDATE: 12-23-1955 , 56  yrs. old GENDER: Female ENDOSCOPIST: Iva Boop, MD, [facility] REFERRED BY:   Dr. Tye Savoy PROCEDURE DATE:  09/18/2012 PROCEDURE:   Colonoscopy with snare polypectomy ASA CLASS:   Class II INDICATIONS:average risk screening and first colonoscopy. MEDICATIONS: propofol (Diprivan) 200mg  IV, MAC sedation, administered by CRNA, and These medications were titrated to patient response per physician's verbal order  DESCRIPTION OF PROCEDURE:   After the risks benefits and alternatives of the procedure were thoroughly explained, informed consent was obtained.  A digital rectal exam revealed no abnormalities of the rectum.   The LB [id number]-[id number]  endoscope was introduced through the anus and advanced to the cecum, which was identified by both the appendix and ileocecal valve. No adverse events experienced.   The quality of the prep was excellent using Suprep  The instrument was then slowly withdrawn as the colon was fully examined.      COLON FINDINGS: Four diminutive sessile polyps were found in the transverse colon, sigmoid colon, and rectum.  A polypectomy was performed with a cold snare.  The resection was complete and the polyp tissue was completely retrieved.   Moderate diverticulosis was noted in the sigmoid colon.   The colon mucosa was otherwise normal.   A right colon retroflexion was performed.  Retroflexed views revealed no abnormalities. The time to cecum=1 minutes 43 seconds.  Withdrawal time=11 minutes 33 seconds.  The scope was withdrawn and the procedure completed. COMPLICATIONS: There were no complications.  ENDOSCOPIC IMPRESSION: 1.   Four diminutive sessile polyps were found in the transverse colon, sigmoid colon, and rectum; polypectomy was performed with a cold snare 2.   Moderate  diverticulosis was noted in the sigmoid colon 3.   The colon mucosa was otherwise normal  RECOMMENDATIONS: Timing of repeat colonoscopy will be determined by pathology findings.   eSigned:  Iva Boop, MD, [facility] 09/18/2012 10:50 AM   cc: The Patient    and Dr. Doree Fudge   PATIENT NAME:  Lisa Nolan, Lisa Nolan MR#: [id number]

## 2012-09-19 ENCOUNTER — Telehealth: Payer: Self-pay | Admitting: *Deleted

## 2012-09-19 NOTE — Telephone Encounter (Signed)
  Follow up Call-  Call back number 09/18/2012  Post procedure Call Back phone  # [phone]  Permission to leave phone message Yes     Patient questions:  Do you have a fever, pain , or abdominal swelling? no Pain Score  0 *  Have you tolerated food without any problems? yes  Have you been able to return to your normal activities? yes  Do you have any questions about your discharge instructions: Diet   no Medications  no Follow up visit  no  Do you have questions or concerns about your Care? no  Actions: * If pain score is 4 or above: No action needed, pain <4.

## 2012-09-24 ENCOUNTER — Encounter: Payer: Self-pay | Admitting: Internal Medicine

## 2012-09-24 DIAGNOSIS — Z860100 Personal history of colon polyps, unspecified: Secondary | ICD-10-CM

## 2012-09-24 DIAGNOSIS — Z860101 Personal history of adenomatous and serrated colon polyps: Secondary | ICD-10-CM | POA: Insufficient documentation

## 2012-09-24 HISTORY — DX: Personal history of colon polyps, unspecified: Z86.0100

## 2012-09-24 HISTORY — DX: Personal history of colonic polyps: Z86.010

## 2012-09-24 NOTE — Progress Notes (Signed)
Quick Note:  1 diminutive adenoma and 3 diminutive hyperplastic polyps Repeat colon about 08/2017 ______

## 2012-12-30 ENCOUNTER — Other Ambulatory Visit: Payer: Self-pay | Admitting: Family Medicine

## 2012-12-30 DIAGNOSIS — N63 Unspecified lump in unspecified breast: Secondary | ICD-10-CM

## 2013-02-02 ENCOUNTER — Ambulatory Visit
Admission: RE | Admit: 2013-02-02 | Discharge: 2013-02-02 | Disposition: A | Discharge status: Home / Self Care | Payer: Medicare Other | Source: Ambulatory Visit | Attending: Family Medicine | Admitting: Family Medicine

## 2013-02-02 DIAGNOSIS — N63 Unspecified lump in unspecified breast: Secondary | ICD-10-CM

## 2013-07-24 ENCOUNTER — Other Ambulatory Visit: Payer: Self-pay | Admitting: Family Medicine

## 2013-07-24 DIAGNOSIS — N6320 Unspecified lump in the left breast, unspecified quadrant: Secondary | ICD-10-CM

## 2013-08-04 ENCOUNTER — Ambulatory Visit
Admission: RE | Admit: 2013-08-04 | Discharge: 2013-08-04 | Disposition: A | Discharge status: Home / Self Care | Payer: Medicare Other | Source: Ambulatory Visit | Attending: Family Medicine | Admitting: Family Medicine

## 2013-08-04 ENCOUNTER — Encounter (INDEPENDENT_AMBULATORY_CARE_PROVIDER_SITE_OTHER): Payer: Self-pay

## 2013-08-04 DIAGNOSIS — N6320 Unspecified lump in the left breast, unspecified quadrant: Secondary | ICD-10-CM

## 2013-10-15 DIAGNOSIS — E785 Hyperlipidemia, unspecified: Secondary | ICD-10-CM | POA: Insufficient documentation

## 2013-10-16 ENCOUNTER — Other Ambulatory Visit: Payer: Self-pay | Admitting: Family Medicine

## 2013-10-16 DIAGNOSIS — Z780 Asymptomatic menopausal state: Secondary | ICD-10-CM

## 2013-10-26 ENCOUNTER — Encounter (INDEPENDENT_AMBULATORY_CARE_PROVIDER_SITE_OTHER): Payer: Self-pay

## 2013-10-26 ENCOUNTER — Ambulatory Visit
Admission: RE | Admit: 2013-10-26 | Discharge: 2013-10-26 | Disposition: A | Discharge status: Home / Self Care | Payer: Medicare Other | Source: Ambulatory Visit | Attending: Family Medicine | Admitting: Family Medicine

## 2013-10-26 DIAGNOSIS — Z780 Asymptomatic menopausal state: Secondary | ICD-10-CM

## 2013-12-31 ENCOUNTER — Other Ambulatory Visit: Payer: Self-pay | Admitting: Family Medicine

## 2013-12-31 DIAGNOSIS — N6320 Unspecified lump in the left breast, unspecified quadrant: Secondary | ICD-10-CM

## 2014-02-05 ENCOUNTER — Ambulatory Visit
Admission: RE | Admit: 2014-02-05 | Discharge: 2014-02-05 | Disposition: A | Discharge status: Home / Self Care | Payer: Medicare Other | Source: Ambulatory Visit | Attending: Family Medicine | Admitting: Family Medicine

## 2014-02-05 DIAGNOSIS — N6320 Unspecified lump in the left breast, unspecified quadrant: Secondary | ICD-10-CM

## 2014-07-22 ENCOUNTER — Other Ambulatory Visit: Payer: Self-pay | Admitting: Family Medicine

## 2014-07-22 DIAGNOSIS — N6320 Unspecified lump in the left breast, unspecified quadrant: Secondary | ICD-10-CM

## 2014-08-10 ENCOUNTER — Ambulatory Visit
Admission: RE | Admit: 2014-08-10 | Discharge: 2014-08-10 | Disposition: A | Discharge status: Home / Self Care | Payer: Medicare HMO | Source: Ambulatory Visit | Attending: Family Medicine | Admitting: Family Medicine

## 2014-08-10 DIAGNOSIS — N6320 Unspecified lump in the left breast, unspecified quadrant: Secondary | ICD-10-CM

## 2015-08-08 ENCOUNTER — Other Ambulatory Visit: Payer: Self-pay

## 2015-08-08 DIAGNOSIS — Z1231 Encounter for screening mammogram for malignant neoplasm of breast: Secondary | ICD-10-CM

## 2015-08-19 ENCOUNTER — Ambulatory Visit
Admission: RE | Admit: 2015-08-19 | Discharge: 2015-08-19 | Disposition: A | Discharge status: Home / Self Care | Payer: Medicare HMO | Source: Ambulatory Visit

## 2015-08-19 DIAGNOSIS — Z1231 Encounter for screening mammogram for malignant neoplasm of breast: Secondary | ICD-10-CM

## 2016-07-10 ENCOUNTER — Other Ambulatory Visit: Payer: Self-pay | Admitting: Family Medicine

## 2016-07-10 DIAGNOSIS — Z1231 Encounter for screening mammogram for malignant neoplasm of breast: Secondary | ICD-10-CM

## 2016-07-16 ENCOUNTER — Other Ambulatory Visit: Payer: Self-pay | Admitting: Family Medicine

## 2016-07-16 DIAGNOSIS — E2839 Other primary ovarian failure: Secondary | ICD-10-CM

## 2016-09-04 ENCOUNTER — Ambulatory Visit
Admission: RE | Admit: 2016-09-04 | Discharge: 2016-09-04 | Disposition: A | Discharge status: Home / Self Care | Payer: Medicare HMO | Source: Ambulatory Visit | Attending: Family Medicine | Admitting: Family Medicine

## 2016-09-04 ENCOUNTER — Ambulatory Visit: Payer: Medicare HMO

## 2016-09-04 DIAGNOSIS — E2839 Other primary ovarian failure: Secondary | ICD-10-CM

## 2016-09-04 DIAGNOSIS — Z1231 Encounter for screening mammogram for malignant neoplasm of breast: Secondary | ICD-10-CM

## 2016-12-18 ENCOUNTER — Ambulatory Visit: Payer: Medicare HMO | Admitting: Internal Medicine

## 2016-12-20 ENCOUNTER — Ambulatory Visit (INDEPENDENT_AMBULATORY_CARE_PROVIDER_SITE_OTHER)
Admission: RE | Admit: 2016-12-20 | Discharge: 2016-12-20 | Disposition: A | Discharge status: Home / Self Care | Payer: Medicare HMO | Source: Ambulatory Visit | Attending: Internal Medicine | Admitting: Internal Medicine

## 2016-12-20 ENCOUNTER — Ambulatory Visit (INDEPENDENT_AMBULATORY_CARE_PROVIDER_SITE_OTHER): Payer: Medicare HMO | Admitting: Internal Medicine

## 2016-12-20 ENCOUNTER — Other Ambulatory Visit: Payer: Medicare HMO

## 2016-12-20 ENCOUNTER — Encounter: Payer: Self-pay | Admitting: Internal Medicine

## 2016-12-20 VITALS — BP 112/70 | HR 76 | Ht 62.0 in | Wt 151.4 lb

## 2016-12-20 DIAGNOSIS — R058 Other specified cough: Secondary | ICD-10-CM

## 2016-12-20 DIAGNOSIS — F17210 Nicotine dependence, cigarettes, uncomplicated: Secondary | ICD-10-CM

## 2016-12-20 DIAGNOSIS — R05 Cough: Secondary | ICD-10-CM | POA: Insufficient documentation

## 2016-12-20 NOTE — Assessment & Plan Note (Signed)

## 2016-12-20 NOTE — Patient Instructions (Addendum)
For cough > mucinex dm up to 1200 mg evey 12 hours as needed  The key is to stop smoking completely before smoking completely stops you!   Add pepcid 20 mg at bedtime x 6 week trial  - just get the over the counter version for now   GERD (REFLUX)  is an extremely common cause of respiratory symptoms just like yours , many times with no obvious heartburn at all.    It can be treated with medication, but also with lifestyle changes including elevation of the head of your bed (ideally with 6 inch  bed blocks),  Smoking cessation, avoidance of late meals, excessive alcohol, and avoid fatty foods, chocolate, peppermint, colas, red wine, and acidic juices such as orange juice.  NO MINT OR MENTHOL PRODUCTS SO NO COUGH DROPS   USE SUGARLESS CANDY INSTEAD (Jolley ranchers or Stover's or Life Savers) or even ice chips will also do - the key is to swallow to prevent all throat clearing. NO OIL BASED VITAMINS - use powdered substitutes-  Hold the fish oil for now and just eat more fish    Please see patient coordinator before you leave today  to schedule sinus CT  Please remember to go to the lab and x-ray department downstairs in the basement  for your tests - we will call you with the results when they are available.     Please schedule a follow up office visit in 6 weeks, call sooner if needed

## 2016-12-20 NOTE — Progress Notes (Addendum)
Subjective:     Patient ID: Lisa Nolan, female   DOB: 1955-11-22       MRN: [id number]  HPI  21 yowf from [facility] active smoker with onset of cough x fall 2017 self referred to pulmonary clinic 12/20/2016    12/20/2016 1st [facility] office visit/ Quin Mathenia   Chief Complaint  Patient presents with  . Pulmonary Consult    Referred by Dr. Melford Aase. Pt c/o cough for at least the past year. She states that she has a prod cough with yellow sputum every am.   indolent onset x 1 y prior to OV   persistent daily cough esp in am a tsp or two and nonproductive for the most part and variably yellow or doe  eg yardwork  Doe = MMRC1 = can walk nl pace, flat grade, can't hurry or go uphills or steps s sob   Assoc nasal congestion nightly and st in am  Never wakes her prematurely/ not worse winter vs summer / not even taking otcs ('don't want to get addicted")  No obvious day to day or daytime variability or assoc  mucus plugs or hemoptysis or cp or chest tightness, subjective wheeze or overt sinus or hb symptoms. No unusual exp hx or h/o childhood pna/ asthma or knowledge of premature birth.  Sleeping ok flat without nocturnal  or early am exacerbation  of respiratory  c/o's or need for noct saba. Also denies any obvious fluctuation of symptoms with weather or environmental changes or other aggravating or alleviating factors except as outlined above   Current Allergies, Complete Past Medical History, Past Surgical History, Family History, and Social History were reviewed in [employer] record.  ROS  The following are not active complaints unless bolded sore throat, dysphagia, dental problems, itching, sneezing,  nasal congestion or disharge of excess mucus or purulent secretions, ear ache,   fever, chills, sweats, unintended wt loss or wt gain, classically pleuritic or exertional cp,  orthopnea pnd or leg swelling, presyncope, palpitations, abdominal pain, anorexia, nausea,  vomiting, diarrhea  or change in bowel habits or bladder habits, change in stools or change in urine, dysuria, hematuria,  rash, arthralgias, visual complaints, headache, numbness, weakness or ataxia or problems with walking or coordination,  change in mood/affect or memory.        Current Meds  Medication Sig  . calcium-vitamin D (OSCAL) 250-125 MG-UNIT per tablet Take 1 tablet by mouth daily.   . cholecalciferol (VITAMIN D) 1000 UNITS tablet Take 2,000 Units by mouth daily.   . Omega-3 Fatty Acids (FISH OIL) 1000 MG CAPS Take 1 capsule by mouth daily.  . psyllium (METAMUCIL MULTIHEALTH FIBER) 58.6 % powder OTC - NO INSTRUCTIONS GIVEN   . Red Yeast Rice Extract 600 MG CAPS Take 2 capsules by mouth daily.        Review of Systems     Objective:   Physical Exam    amb pleasant wf nad  Wt Readings from Last 3 Encounters:  12/20/16 151 lb 6.4 oz (68.7 kg)  09/18/12 174 lb (78.9 kg)  09/12/12 173 lb (78.5 kg)    Vital signs reviewed  - Note on arrival 02 sats  97% on RA     HEENT: nl   turbinates bilaterally, and oropharynx. Nl external ear canals without cough reflex - upper dentures   NECK :  without JVD/Nodes/TM/ nl carotid upstrokes bilaterally   LUNGS: no acc muscle use,  Nl contour chest which is clear  to A and P bilaterally without cough on insp or exp maneuvers   CV:  RRR  no s3 or murmur or increase in P2, and no edema   ABD:  soft and nontender with nl inspiratory excursion in the supine position. No bruits or organomegaly appreciated, bowel sounds nl  MS:  Nl gait/ ext warm without deformities, calf tenderness, cyanosis or clubbing No obvious joint restrictions   SKIN: warm and dry without lesions    NEURO:  alert, approp, nl sensorium with  no motor or cerebellar deficits apparent.    CXR PA and Lateral:   12/20/2016 :    I personally reviewed images and agree with radiology impression as follows:    Mediastinum and hilar structures normal. Lungs are clear.  Heart size normal. No pleural effusion or pneumothorax. Thoracolumbar spine scoliosis and degenerative change   Assessment:

## 2016-12-20 NOTE — Assessment & Plan Note (Addendum)
Spirometry 12/20/2016  wnl  Allergy profile 12/20/2016 >  Eos 0. /  IgE   - Sinus CT 12/20/2016 >>>    The most common causes of chronic cough in immunocompetent adults include the following: upper airway cough syndrome (UACS), previously referred to as postnasal drip syndrome (PNDS), which is caused by variety of rhinosinus conditions; (2) asthma; (3) GERD; (4) chronic bronchitis from cigarette smoking or other inhaled environmental irritants; (5) nonasthmatic eosinophilic bronchitis; and (6) bronchiectasis.   These conditions, singly or in combination, have accounted for up to 94% of the causes of chronic cough in prospective studies.   Other conditions have constituted no >6% of the causes in prospective studies These have included bronchogenic carcinoma, chronic interstitial pneumonia, sarcoidosis, left ventricular failure, ACEI-induced cough, and aspiration from a condition associated with pharyngeal dysfunction.    Chronic cough is often simultaneously caused by more than one condition. A single cause has been found from 38 to 82% of the time, multiple causes from 18 to 62%. Multiply caused cough has been the result of three diseases up to 42% of the time.   Most likely this is not typical cb, though she may have an element of this for sure, but rather predominantly Upper airway cough syndrome (previously labeled PNDS),  is so named because it's frequently impossible to sort out how much is  CR/sinusitis with freq throat clearing (which can be related to primary GERD)   vs  causing  secondary (" extra esophageal")  GERD from wide swings in gastric pressure that occur with throat clearing, often  promoting self use of mint and menthol lozenges that reduce the lower esophageal sphincter tone and exacerbate the problem further in a cyclical fashion.   These are the same pts (now being labeled as having "irritable larynx syndrome" by some cough centers) who not infrequently have a history of having  failed to tolerate ace inhibitors,  dry powder inhalers or biphosphonates or report having atypical/extraesophageal reflux symptoms that don't respond to standard doses of PPI  and are easily confused as having aecopd or asthma flares by even experienced allergists/ pulmonologists (myself included).    W/u with allergy profile, sinus ct and rx with noct pepcid for now plus  mucinex dm and work hard on smoking cessation (see separate a/p)    Total time devoted to counseling  > 50 % of initial 60 min office visit:  review case with pt/longterm boyfriend discussion of options/alternatives/ personally creating written customized instructions  in presence of pt  then going over those specific  Instructions directly with the pt including how to use all of the meds but in particular covering each new medication in detail and the difference between the maintenance= "automatic" meds and the prns using an action plan format for the latter (If this problem/symptom => do that organization reading Left to right).  Please see AVS from this visit for a full list of these instructions which I personally wrote for this pt and  are unique to this visit.

## 2016-12-21 ENCOUNTER — Other Ambulatory Visit (INDEPENDENT_AMBULATORY_CARE_PROVIDER_SITE_OTHER): Payer: Medicare HMO

## 2016-12-21 DIAGNOSIS — R059 Cough, unspecified: Secondary | ICD-10-CM

## 2016-12-21 DIAGNOSIS — R05 Cough: Secondary | ICD-10-CM

## 2016-12-21 LAB — CBC WITH DIFFERENTIAL/PLATELET
Basophils Absolute: 0 10*3/uL (ref 0.0–0.1)
Basophils Relative: 0.4 % (ref 0.0–3.0)
EOS ABS: 0.1 10*3/uL (ref 0.0–0.7)
Eosinophils Relative: 1.2 % (ref 0.0–5.0)
HCT: 45.6 % (ref 36.0–46.0)
HEMOGLOBIN: 15.2 g/dL — AB (ref 12.0–15.0)
LYMPHS ABS: 2.2 10*3/uL (ref 0.7–4.0)
Lymphocytes Relative: 23.5 % (ref 12.0–46.0)
MCHC: 33.4 g/dL (ref 30.0–36.0)
MCV: 93.7 fl (ref 78.0–100.0)
Monocytes Absolute: 0.6 10*3/uL (ref 0.1–1.0)
Monocytes Relative: 6.4 % (ref 3.0–12.0)
NEUTROS PCT: 68.5 % (ref 43.0–77.0)
Neutro Abs: 6.5 10*3/uL (ref 1.4–7.7)
Platelets: 284 10*3/uL (ref 150.0–400.0)
RBC: 4.87 Mil/uL (ref 3.87–5.11)
RDW: 12.9 % (ref 11.5–15.5)
WBC: 9.5 10*3/uL (ref 4.0–10.5)

## 2016-12-24 LAB — RESPIRATORY ALLERGY PROFILE REGION II ~~LOC~~
Allergen, A. alternata, m6: <0.1 kU/L
Allergen, Cedar tree, t12: <0.1 kU/L
Allergen, Comm Silver Birch, t9: <0.1 kU/L
Allergen, Cottonwood, t14: <0.1 kU/L
Allergen, Mulberry, t76: <0.1 kU/L
CLADOSPORIUM HERBARUM (M2) IGE: <0.1 kU/L
CLASS: 0
CLASS: 0
CLASS: 0
CLASS: 0
CLASS: 0
CLASS: 0
CLASS: 0
COMMON RAGWEED (SHORT) (W1) IGE: <0.1 kU/L
Cat Dander: 7.36 kU/L — ABNORMAL HIGH
Class: 0
Class: 0
Class: 0
Class: 0
Class: 0
Class: 0
Class: 0
Class: 0
Class: 0
Class: 0
Class: 0
Class: 0
Class: 0
Class: 0
Class: 0
Class: 0
Class: 3
Dog Dander: <0.1 kU/L
IGE (IMMUNOGLOBULIN E), SERUM: 51 kU/L (ref <=114)
Pecan/Hickory Tree IgE: <0.1 kU/L

## 2016-12-24 LAB — INTERPRETATION:

## 2016-12-24 NOTE — Progress Notes (Signed)
Spoke with pt and notified of results per Dr. Wert. Pt verbalized understanding and denied any questions. 

## 2016-12-25 ENCOUNTER — Ambulatory Visit (INDEPENDENT_AMBULATORY_CARE_PROVIDER_SITE_OTHER)
Admission: RE | Admit: 2016-12-25 | Discharge: 2016-12-25 | Disposition: A | Discharge status: Home / Self Care | Payer: Medicare HMO | Source: Ambulatory Visit | Attending: Internal Medicine | Admitting: Internal Medicine

## 2016-12-25 DIAGNOSIS — R05 Cough: Secondary | ICD-10-CM | POA: Diagnosis not present

## 2016-12-25 DIAGNOSIS — R058 Other specified cough: Secondary | ICD-10-CM

## 2016-12-26 NOTE — Progress Notes (Signed)
Spoke with pt and notified of results per Dr. Wert. Pt verbalized understanding and denied any questions. 

## 2017-01-31 ENCOUNTER — Ambulatory Visit: Payer: Medicare HMO | Admitting: Internal Medicine

## 2017-06-26 DIAGNOSIS — F17210 Nicotine dependence, cigarettes, uncomplicated: Secondary | ICD-10-CM | POA: Insufficient documentation

## 2017-08-09 ENCOUNTER — Other Ambulatory Visit: Payer: Self-pay | Admitting: Family Medicine

## 2017-08-09 DIAGNOSIS — Z1231 Encounter for screening mammogram for malignant neoplasm of breast: Secondary | ICD-10-CM

## 2017-09-11 ENCOUNTER — Ambulatory Visit: Payer: Medicare HMO

## 2017-11-01 ENCOUNTER — Ambulatory Visit
Admission: RE | Admit: 2017-11-01 | Discharge: 2017-11-01 | Disposition: A | Discharge status: Home / Self Care | Payer: Medicare HMO | Source: Ambulatory Visit | Attending: Family Medicine | Admitting: Family Medicine

## 2017-11-01 DIAGNOSIS — Z1231 Encounter for screening mammogram for malignant neoplasm of breast: Secondary | ICD-10-CM

## 2017-11-18 ENCOUNTER — Encounter: Payer: Self-pay | Admitting: Internal Medicine

## 2017-11-22 ENCOUNTER — Encounter: Payer: Self-pay | Admitting: Internal Medicine

## 2017-12-24 ENCOUNTER — Ambulatory Visit (AMBULATORY_SURGERY_CENTER): Payer: Self-pay | Admitting: *Deleted

## 2017-12-24 ENCOUNTER — Encounter: Payer: Self-pay | Admitting: Internal Medicine

## 2017-12-24 VITALS — Ht 62.0 in | Wt 157.0 lb

## 2017-12-24 DIAGNOSIS — Z86010 Personal history of colonic polyps: Secondary | ICD-10-CM

## 2017-12-24 NOTE — Progress Notes (Signed)
No egg or soy allergy known to patient  No issues with past sedation with any surgeries  or procedures, no intubation problems  No diet pills per patient No home 02 use per patient  No blood thinners per patient  Pt denies issues with constipation  No A fib or A flutter  EMMI video sent to pt's e mail  

## 2018-01-03 ENCOUNTER — Ambulatory Visit (AMBULATORY_SURGERY_CENTER): Payer: Medicare HMO | Admitting: Internal Medicine

## 2018-01-03 ENCOUNTER — Encounter: Payer: Self-pay | Admitting: Internal Medicine

## 2018-01-03 VITALS — BP 113/71 | HR 60 | Temp 99.3°F | Resp 17

## 2018-01-03 DIAGNOSIS — D129 Benign neoplasm of anus and anal canal: Secondary | ICD-10-CM

## 2018-01-03 DIAGNOSIS — K635 Polyp of colon: Secondary | ICD-10-CM

## 2018-01-03 DIAGNOSIS — D120 Benign neoplasm of cecum: Secondary | ICD-10-CM

## 2018-01-03 DIAGNOSIS — D126 Benign neoplasm of colon, unspecified: Secondary | ICD-10-CM

## 2018-01-03 DIAGNOSIS — K621 Rectal polyp: Secondary | ICD-10-CM | POA: Diagnosis not present

## 2018-01-03 DIAGNOSIS — D123 Benign neoplasm of transverse colon: Secondary | ICD-10-CM

## 2018-01-03 DIAGNOSIS — Z86010 Personal history of colonic polyps: Secondary | ICD-10-CM

## 2018-01-03 DIAGNOSIS — D128 Benign neoplasm of rectum: Secondary | ICD-10-CM

## 2018-01-03 DIAGNOSIS — D125 Benign neoplasm of sigmoid colon: Secondary | ICD-10-CM

## 2018-01-03 MED ORDER — SODIUM CHLORIDE 0.9 % IV SOLN: 500.0000 mL | Freq: Once | INTRAVENOUS | Status: DC

## 2018-01-03 NOTE — Progress Notes (Signed)
Called to room to assist during endoscopic procedure.  Patient ID and intended procedure confirmed with present staff. Received instructions for my participation in the procedure from the performing physician.  

## 2018-01-03 NOTE — Patient Instructions (Addendum)
   I found and removed 5 polyps today.  I will let you know pathology results and when to have another routine colonoscopy by mail and/or My Chart.  I appreciate the opportunity to care for you. Gatha Mayer, MD, FACG YOU HAD AN ENDOSCOPIC PROCEDURE TODAY AT [facility]:   Refer to the procedure report that was given to you for any specific questions about what was found during the examination.  If the procedure report does not answer your questions, please call your gastroenterologist to clarify.  If you requested that your care partner not be given the details of your procedure findings, then the procedure report has been included in a sealed envelope for you to review at your convenience later.  YOU SHOULD EXPECT: Some feelings of bloating in the abdomen. Passage of more gas than usual.  Walking can help get rid of the air that was put into your GI tract during the procedure and reduce the bloating. If you had a lower endoscopy (such as a colonoscopy or flexible sigmoidoscopy) you may notice spotting of blood in your stool or on the toilet paper. If you underwent a bowel prep for your procedure, you may not have a normal bowel movement for a few days.  Please Note:  You might notice some irritation and congestion in your nose or some drainage.  This is from the oxygen used during your procedure.  There is no need for concern and it should clear up in a day or so.  SYMPTOMS TO REPORT IMMEDIATELY:   Following lower endoscopy (colonoscopy or flexible sigmoidoscopy):  Excessive amounts of blood in the stool  Significant tenderness or worsening of abdominal pains  Swelling of the abdomen that is new, acute  Fever of 100F or higher   For urgent or emergent issues, a gastroenterologist can be reached at any hour by calling [phone].   DIET:  We do recommend a small meal at first, but then you may proceed to your regular diet.  Drink plenty of fluids but you  should avoid alcoholic beverages for 24 hours.  ACTIVITY:  You should plan to take it easy for the rest of today and you should NOT DRIVE or use heavy machinery until tomorrow (because of the sedation medicines used during the test).    FOLLOW UP: Our staff will call the number listed on your records the next business day following your procedure to check on you and address any questions or concerns that you may have regarding the information given to you following your procedure. If we do not reach you, we will leave a message.  However, if you are feeling well and you are not experiencing any problems, there is no need to return our call.  We will assume that you have returned to your regular daily activities without incident.  If any biopsies were taken you will be contacted by phone or by letter within the next 1-3 weeks.  Please call us at [phone] if you have not heard about the biopsies in 3 weeks.    SIGNATURES/CONFIDENTIALITY: You and/or your care partner have signed paperwork which will be entered into your electronic medical record.  These signatures attest to the fact that that the information above on your After Visit Summary has been reviewed and is understood.  Full responsibility of the confidentiality of this discharge information lies with you and/or your care-partner.

## 2018-01-03 NOTE — Op Note (Signed)
[facility] Patient Name: Lisa Nolan Procedure Date: 01/03/2018 9:49 AM MRN: [id number] Endoscopist: Gatha Mayer , MD Age: 62 Referring MD:  Date of Birth: Jul 21, 1955 Gender: Female Account #: [id number] Procedure:                Colonoscopy Indications:              Surveillance: Personal history of adenomatous                            polyps on last colonoscopy 5 years ago Medicines:                Propofol per Anesthesia, Monitored Anesthesia Care Procedure:                Pre-Anesthesia Assessment:                           - Prior to the procedure, a History and Physical                            was performed, and patient medications and                            allergies were reviewed. The patient's tolerance of                            previous anesthesia was also reviewed. The risks                            and benefits of the procedure and the sedation                            options and risks were discussed with the patient.                            All questions were answered, and informed consent                            was obtained. Prior Anticoagulants: The patient has                            taken no previous anticoagulant or antiplatelet                            agents. ASA Grade Assessment: II - A patient with                            mild systemic disease. After reviewing the risks                            and benefits, the patient was deemed in                            satisfactory condition to undergo the procedure.  After obtaining informed consent, the colonoscope                            was passed under direct vision. Throughout the                            procedure, the patient's blood pressure, pulse, and                            oxygen saturations were monitored continuously. The                            Colonoscope was introduced through the anus and   advanced to the the cecum, identified by                            appendiceal orifice and ileocecal valve. The                            colonoscopy was performed without difficulty. The                            patient tolerated the procedure well. The quality                            of the bowel preparation was excellent. The                            ileocecal valve, appendiceal orifice, and rectum                            were photographed. The bowel preparation used was                            Miralax. Scope In: 9:53:05 AM Scope Out: 10:10:14 AM Scope Withdrawal Time: 0 hours 13 minutes 24 seconds  Total Procedure Duration: 0 hours 17 minutes 9 seconds  Findings:                 Three sessile and semi-pedunculated polyps were                            found in the rectum, sigmoid colon and transverse                            colon. The polyps were diminutive in size. These                            polyps were removed with a cold snare. Resection                            and retrieval were complete. Verification of                            patient identification for the specimen  was done.                            Estimated blood loss was minimal.                           Two sessile polyps were found in the transverse                            colon and cecum. The polyps were 1 to 2 mm in size.                            These polyps were removed with a cold biopsy                            forceps. Resection and retrieval were complete.                            Verification of patient identification for the                            specimen was done. Estimated blood loss was minimal.                           Multiple diverticula were found in the sigmoid                            colon.                           The exam was otherwise without abnormality on                            direct and retroflexion views. Complications:            No  immediate complications. Estimated Blood Loss:     Estimated blood loss was minimal. Impression:               - Three diminutive polyps in the rectum, in the                            sigmoid colon and in the transverse colon, removed                            with a cold snare. Resected and retrieved.                           - Two 1 to 2 mm polyps in the transverse colon and                            in the cecum, removed with a cold biopsy forceps.                            Resected and retrieved.                           -  Diverticulosis in the sigmoid colon.                           - The examination was otherwise normal on direct                            and retroflexion views.                           - Personal history of colonic polyps. Recommendation:           - Patient has a contact number available for                            emergencies. The signs and symptoms of potential                            delayed complications were discussed with the                            patient. Return to normal activities tomorrow.                            Written discharge instructions were provided to the                            patient.                           - Resume previous diet.                           - Continue present medications.                           - Repeat colonoscopy is recommended for                            surveillance. The colonoscopy date will be                            determined after pathology results from today's                            exam become available for review. Gatha Mayer, MD 01/03/2018 10:21:16 AM This report has been signed electronically.

## 2018-01-03 NOTE — Progress Notes (Signed)
Report given to PACU, vss 

## 2018-01-03 NOTE — Progress Notes (Signed)
Pt's states no medical or surgical changes since previsit or office visit. 

## 2018-01-06 ENCOUNTER — Telehealth: Payer: Self-pay

## 2018-01-06 ENCOUNTER — Encounter: Payer: Medicare HMO | Admitting: Internal Medicine

## 2018-01-06 ENCOUNTER — Telehealth: Payer: Self-pay | Admitting: *Deleted

## 2018-01-06 NOTE — Telephone Encounter (Signed)
  Follow up Call-  Call back number 01/03/2018  Post procedure Call Back phone  # [phone]  Permission to leave phone message Yes  Some recent data might be hidden     Patient questions:  Do you have a fever, pain , or abdominal swelling? No. Pain Score  0 *  Have you tolerated food without any problems? Yes.    Have you been able to return to your normal activities? Yes.    Do you have any questions about your discharge instructions: Diet   No. Medications  No. Follow up visit  No.  Do you have questions or concerns about your Care? No.  Actions: * If pain score is 4 or above: No action needed, pain <4.

## 2018-01-06 NOTE — Telephone Encounter (Signed)
First post procedure follow up call, no answer 

## 2018-01-17 ENCOUNTER — Encounter: Payer: Self-pay | Admitting: Internal Medicine

## 2018-01-17 DIAGNOSIS — Z86010 Personal history of colonic polyps: Secondary | ICD-10-CM

## 2018-01-17 NOTE — Progress Notes (Signed)
1 adenoma Recall 2024

## 2018-08-29 IMAGING — DX DG CHEST 2V
2 series · 2 of 2 positions shown · non-contrast
Comparison: 05/30/2010.

CLINICAL DATA: Chronic cough.

EXAM:
CHEST  2 VIEW

[chest pa]
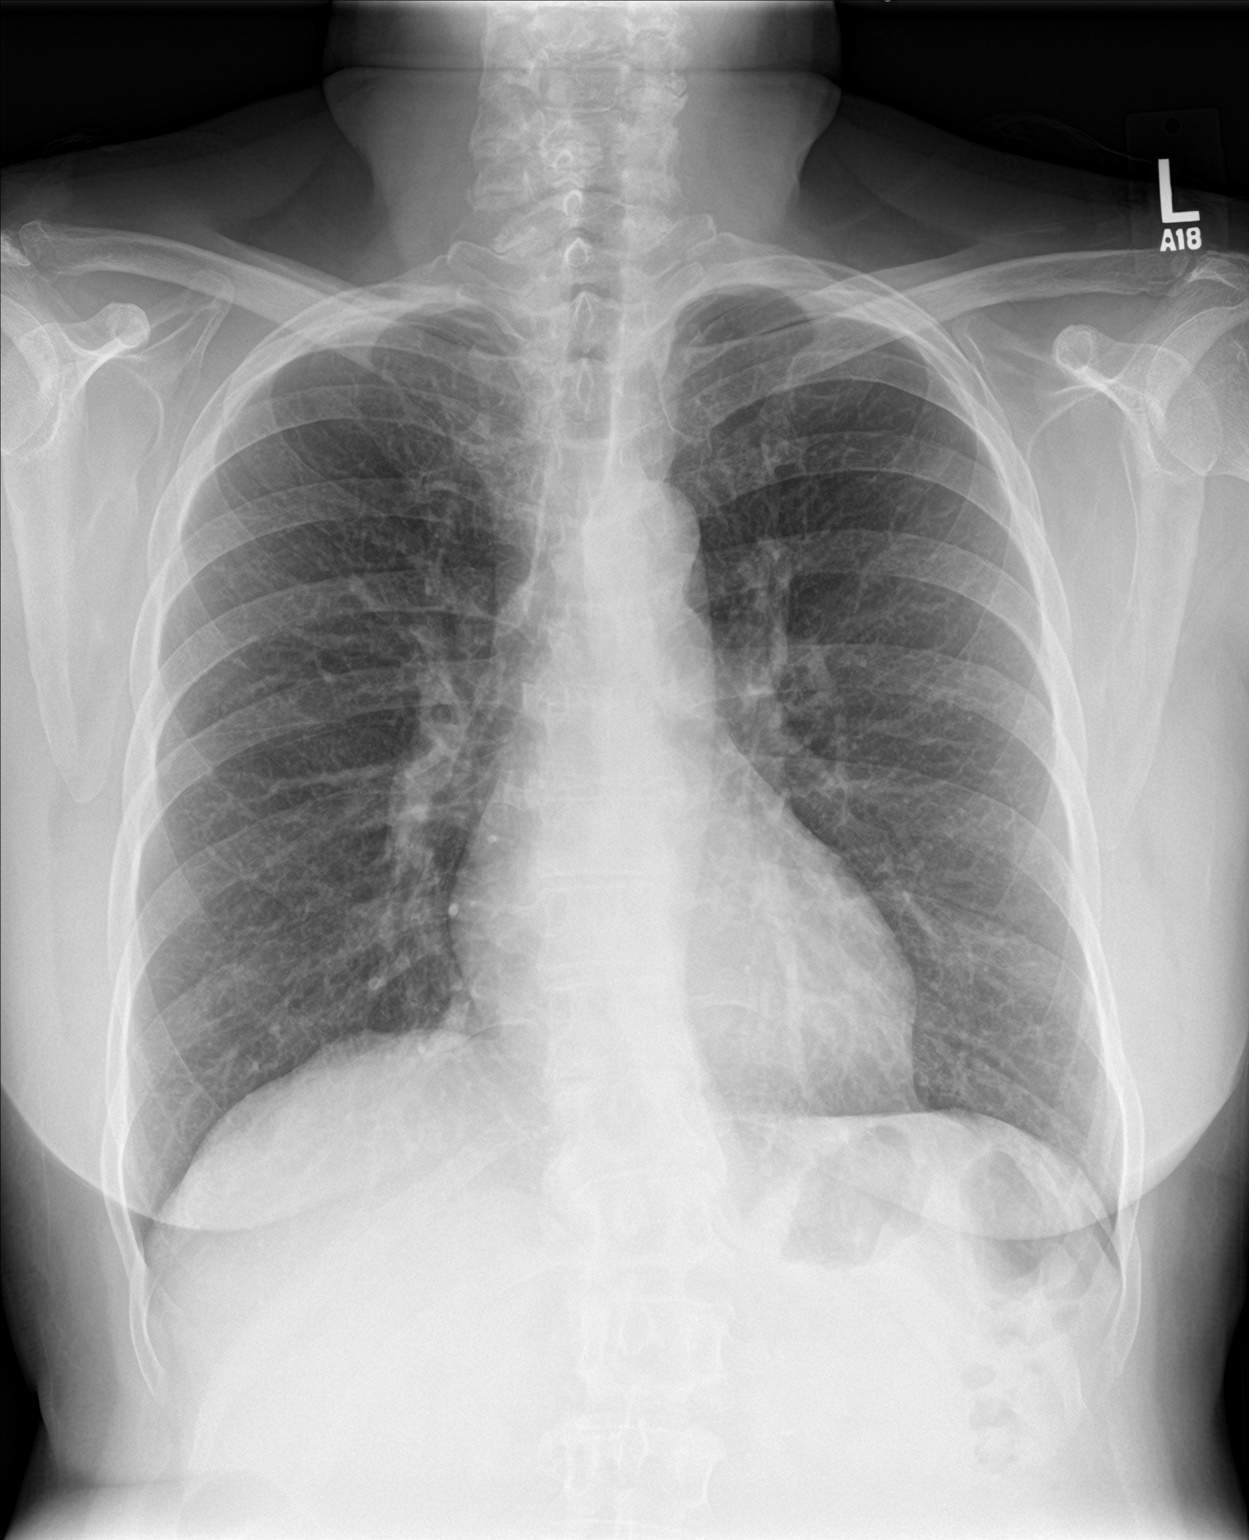

[chest lat]
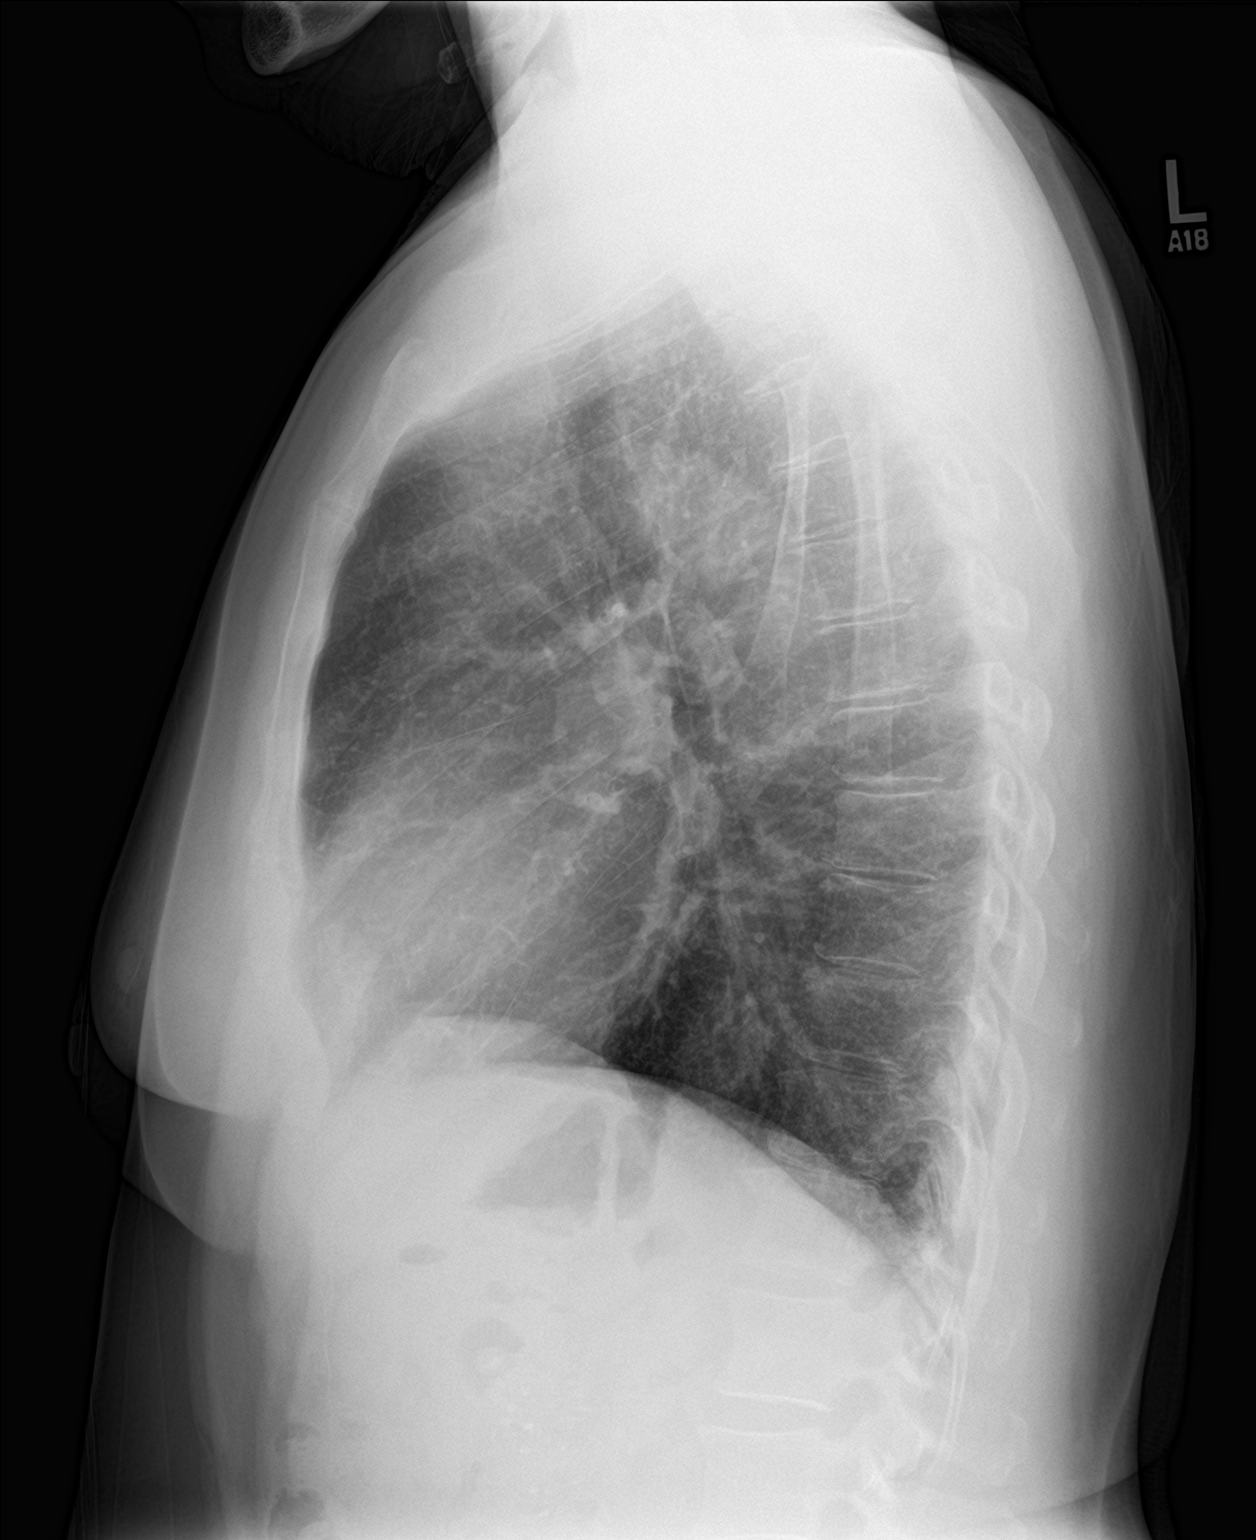

[2 of 2 positions shown; findings below may reference images not displayed]

FINDINGS: Mediastinum and hilar structures normal. Lungs are clear. Heart size
normal. No pleural effusion or pneumothorax. Thoracolumbar spine
scoliosis and degenerative change. Pancreatic calcifications
suggesting chronic pancreatitis.
IMPRESSION: 1.  No acute cardiopulmonary disease.

2.  Pancreatic calcifications suggesting chronic pancreatitis.

## 2018-09-03 IMAGING — CT CT PARANASAL SINUSES LIMITED
1 of 2 series · 7 of 10 positions shown, 9 images · non-contrast
Comparison: None.

CLINICAL DATA: 61 y/o F; right-sided sinus congestion and drainage.
Evaluate for sinusitis.

EXAM:
CT PARANASAL SINUS LIMITED WITHOUT CONTRAST
TECHNIQUE: Non-contiguous multidetector CT images of the paranasal sinuses were
obtained in a single plane without contrast.

[Series 4: limited sinus st · axial · 0.22mm/px · z∈[+165,+225]mm · 7 of 9 slices shown, 9 images]
[im 2/9  brain]
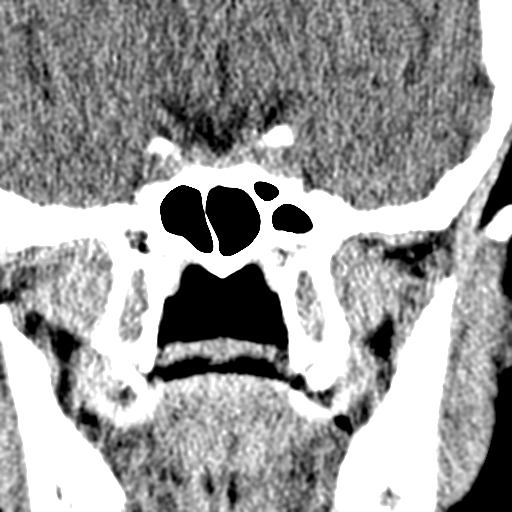
[im 2/9  bone]
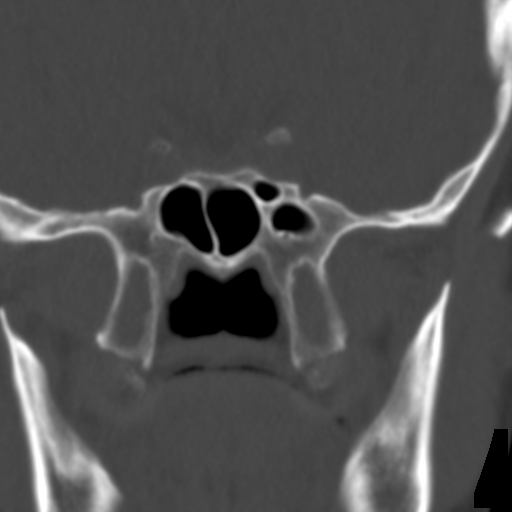
[im 3/9  bone]
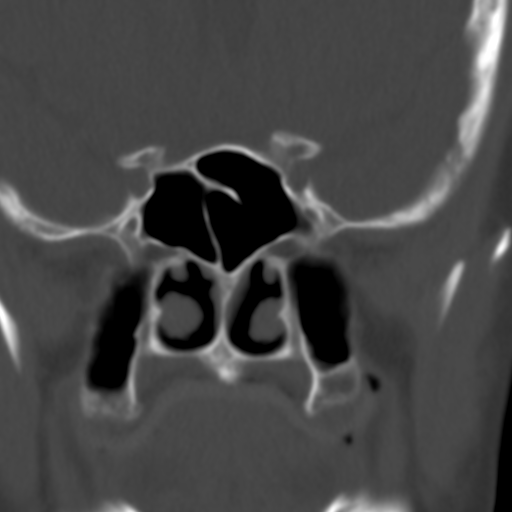
[im 4/9  bone]
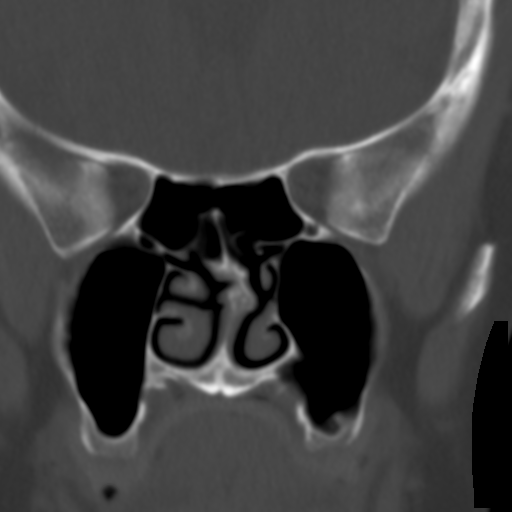
[im 5/9  bone]
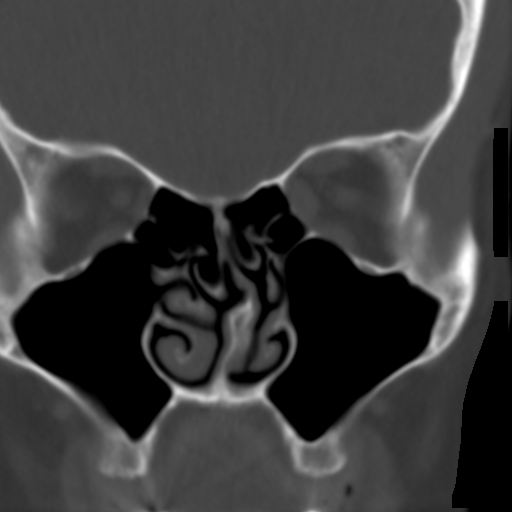
[im 6/9  brain]
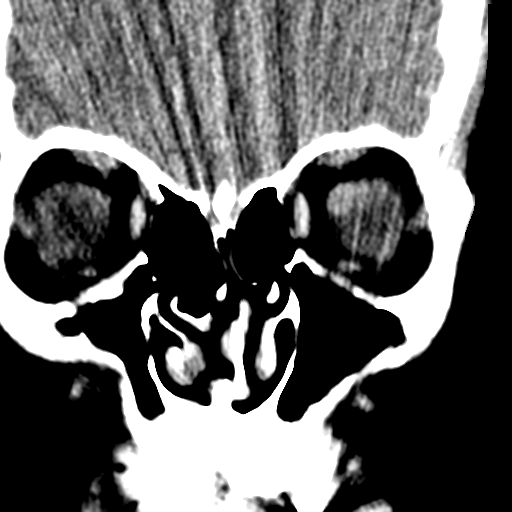
[im 6/9  bone]
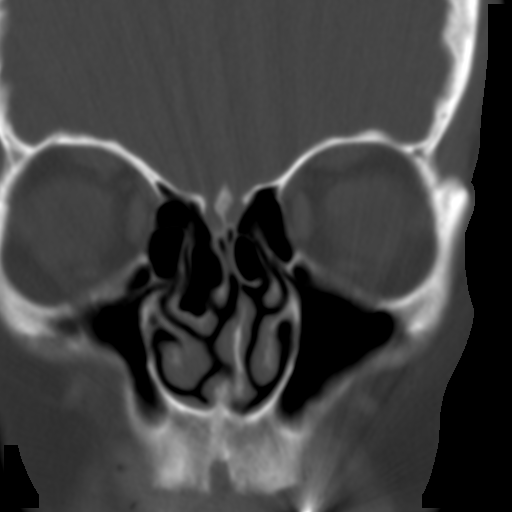
[im 7/9  bone]
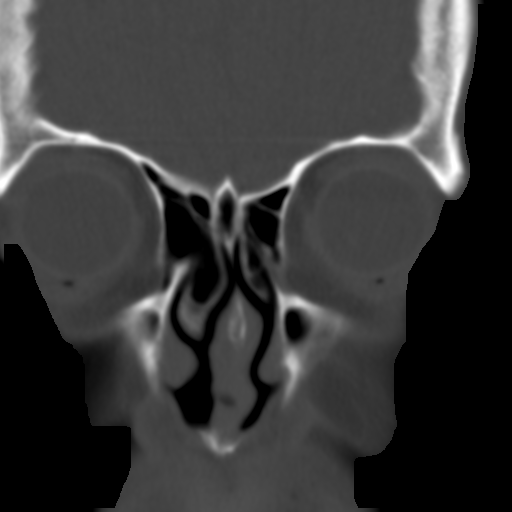
[im 8/9  bone]
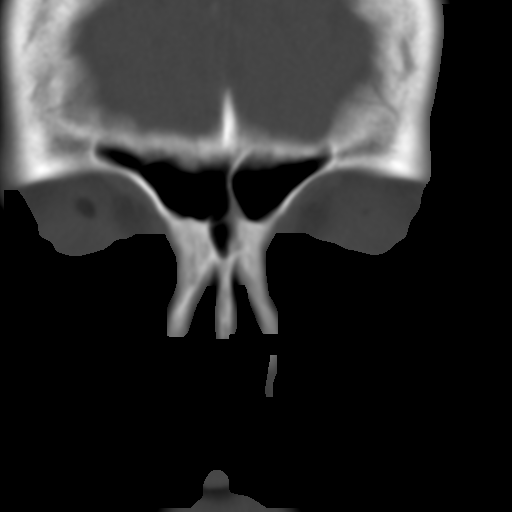

[7 of 10 positions shown; findings below may reference images not displayed]

FINDINGS: No paranasal sinus mucosal thickening or fluid levels. Patent
frontal sinus drainage pathways and ostiomeatal units. 7 mm
rightward nasal septal deviation. Pneumatization of left middle
turbinate.
IMPRESSION: Normally aerated paranasal sinuses. Patent sinus drainage pathways.
Rightward nasal septal deviation.

By: Orisen Gunda M.D.

## 2018-12-04 ENCOUNTER — Other Ambulatory Visit: Payer: Self-pay | Admitting: Family Medicine

## 2018-12-04 DIAGNOSIS — R5381 Other malaise: Secondary | ICD-10-CM

## 2018-12-04 DIAGNOSIS — Z1231 Encounter for screening mammogram for malignant neoplasm of breast: Secondary | ICD-10-CM

## 2019-03-18 ENCOUNTER — Other Ambulatory Visit: Payer: Medicare HMO

## 2019-03-18 ENCOUNTER — Ambulatory Visit: Payer: Medicare HMO

## 2019-06-10 ENCOUNTER — Other Ambulatory Visit: Payer: Self-pay | Admitting: Family Medicine

## 2019-06-10 DIAGNOSIS — E2839 Other primary ovarian failure: Secondary | ICD-10-CM

## 2019-06-12 ENCOUNTER — Ambulatory Visit
Admission: RE | Admit: 2019-06-12 | Discharge: 2019-06-12 | Disposition: A | Discharge status: Home / Self Care | Payer: Medicare Other | Source: Ambulatory Visit | Attending: Family Medicine | Admitting: Family Medicine

## 2019-06-12 ENCOUNTER — Other Ambulatory Visit: Payer: Self-pay

## 2019-06-12 DIAGNOSIS — Z1231 Encounter for screening mammogram for malignant neoplasm of breast: Secondary | ICD-10-CM

## 2019-06-12 DIAGNOSIS — E2839 Other primary ovarian failure: Secondary | ICD-10-CM

## 2020-05-03 ENCOUNTER — Other Ambulatory Visit: Payer: Self-pay | Admitting: Family Medicine

## 2020-05-03 DIAGNOSIS — Z1231 Encounter for screening mammogram for malignant neoplasm of breast: Secondary | ICD-10-CM

## 2020-06-17 ENCOUNTER — Other Ambulatory Visit: Payer: Self-pay

## 2020-06-17 ENCOUNTER — Ambulatory Visit
Admission: RE | Admit: 2020-06-17 | Discharge: 2020-06-17 | Disposition: A | Discharge status: Home / Self Care | Payer: Medicare Other | Source: Ambulatory Visit | Attending: Family Medicine | Admitting: Family Medicine

## 2020-06-17 DIAGNOSIS — Z1231 Encounter for screening mammogram for malignant neoplasm of breast: Secondary | ICD-10-CM

## 2021-05-12 ENCOUNTER — Other Ambulatory Visit: Payer: Self-pay | Admitting: Family Medicine

## 2021-05-12 DIAGNOSIS — Z1231 Encounter for screening mammogram for malignant neoplasm of breast: Secondary | ICD-10-CM

## 2021-06-15 ENCOUNTER — Other Ambulatory Visit: Payer: Self-pay | Admitting: Family Medicine

## 2021-06-19 ENCOUNTER — Ambulatory Visit: Payer: Medicare (Managed Care)

## 2021-06-21 ENCOUNTER — Ambulatory Visit
Admission: RE | Admit: 2021-06-21 | Discharge: 2021-06-21 | Disposition: A | Discharge status: Home / Self Care | Payer: Medicare (Managed Care) | Source: Ambulatory Visit | Attending: Family Medicine | Admitting: Family Medicine

## 2021-06-21 DIAGNOSIS — Z1231 Encounter for screening mammogram for malignant neoplasm of breast: Secondary | ICD-10-CM

## 2021-06-22 ENCOUNTER — Other Ambulatory Visit: Payer: Self-pay | Admitting: Family Medicine

## 2021-06-22 DIAGNOSIS — R928 Other abnormal and inconclusive findings on diagnostic imaging of breast: Secondary | ICD-10-CM

## 2021-07-31 ENCOUNTER — Ambulatory Visit
Admission: RE | Admit: 2021-07-31 | Discharge: 2021-07-31 | Disposition: A | Discharge status: Home / Self Care | Payer: Medicare (Managed Care) | Source: Ambulatory Visit | Attending: Family Medicine | Admitting: Family Medicine

## 2021-07-31 DIAGNOSIS — R928 Other abnormal and inconclusive findings on diagnostic imaging of breast: Secondary | ICD-10-CM

## 2021-10-25 DIAGNOSIS — K603 Anal fistula: Secondary | ICD-10-CM | POA: Insufficient documentation

## 2021-11-07 ENCOUNTER — Encounter: Payer: Self-pay | Admitting: Radiation Oncology

## 2021-11-07 NOTE — Progress Notes (Signed)
Radiation Oncology         [phone] ________________________________  Initial Outpatient Consultation  Name: Lisa Nolan MRN: [id number]  Date: 11/08/2021  DOB: 18-Aug-1955  YD:XAJOIN, Rebeca Alert, MD  Oh, Juluis Pitch, MD   REFERRING PHYSICIAN: Bronwen Betters, MD  DIAGNOSIS: There were no encounter diagnoses.  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 female who is accompanied by ***. she is seen as a courtesy of Dr. Doristine Church Oh for an opinion concerning radiation therapy as part of management for her recently diagnosed anal cancer. The patient presented to her PCP on 10/11/21 for evaluation of a hard anal mass which she first noticed 4 weeks prior. During that visit, the patient reported having some bloody stools, but denied any pain or aggravating factors. Examination performed revealed a 1 cm firm peri anal mass with ulceration times x2 on the left side of the anus. For management, her PCP prescribed her hydrocortisone cream to apply rectally, and placed a referral to general surgery for biopsies.      Accordingly, the patient then met with Dr. Samule Ohm ([facility]) on 10/25/21 who performed a subcutaneous fistulotomy. Biopsy of anal mass collected revealed findings consistent with moderately differentiated invasive squamous cell carcinoma extending to the deep margin. (Patient opted against biopsies under anesthesia given that she lacked transportation following the procedure).   The patient was then promptly referred to Dr. Pauline Aus ([facility]) on 10/31/21 to discuss treatment options. During this visit, the patient stated that she only recently started driving and did no feel comfortable driving to [facility] for daily radiation treatments. Given so, Dr. Candace Cruise referred the patient to myself so that she may receive treatment closer to her home in [place].  In terms of treatment options, Dr. Candace Cruise has recommended definitive chemoradiation with 5-FU and Mitomycin C over the course of 6 weeks. Dr. Candace Cruise has also recommended a PET scan for staging purposes prior to RT which we will set up for her.   Of note: The patient's most recent screening mammogram on 06/21/21 showed a possible mass in the right breast. Diagnostic right breast mammogram and ultrasound on 07/31/21 revealed benign cysts within the upper outer right breast corresponding to screening mammogram findings.    PREVIOUS RADIATION THERAPY: No  PAST MEDICAL HISTORY:  Past Medical History:  Diagnosis Date   Allergy    mild   Anal cancer ([place])    Anemia    as child   Anxiety    Chronic back pain    Depression    Hepatitis C    Hyperlipidemia    Personal history of colonic adenoma 09/24/2012    PAST SURGICAL HISTORY: Past Surgical History:  Procedure Laterality Date   COLONOSCOPY     POLYPECTOMY     TONSILLECTOMY  1963    FAMILY HISTORY:  Family History  Problem Relation Age of Onset   Colon cancer Maternal Aunt 70   Colon polyps Maternal Aunt    Diabetes Mother    Diabetes Sister    Breast cancer Neg Hx    Esophageal cancer Neg Hx    Rectal cancer Neg Hx    Stomach cancer Neg Hx     SOCIAL HISTORY:  Social History   Tobacco Use   Smoking status: Former    Packs/day: 0.75    Years: 40.00    Total pack years: 30.00  Types: Cigarettes    Quit date: 11/13/2017    Years since quitting: 3.9   Smokeless tobacco: Never  Vaping Use   Vaping Use: Never used  Substance Use Topics   Alcohol use: Yes    Alcohol/week: 8.0 standard drinks of alcohol    Types: 7 Glasses of wine, 1 Standard drinks or equivalent per week   Drug use: No    ALLERGIES:  Allergies  Allergen Reactions   Chantix [Varenicline] Other (See Comments)    Made her depressed   Statins     myalgias    MEDICATIONS:  Current Outpatient Medications  Medication Sig Dispense Refill    calcium-vitamin D (OSCAL) 250-125 MG-UNIT per tablet Take 1 tablet by mouth daily.      cholecalciferol (VITAMIN D) 1000 UNITS tablet Take 2,000 Units by mouth daily.      ezetimibe (ZETIA) 10 MG tablet Take by mouth.     oxycodone (OXY-IR) 5 MG capsule Take 1 capsule (5 mg total) by mouth 2 (two) times daily as needed for pain. 60 capsule 0   psyllium (METAMUCIL MULTIHEALTH FIBER) 58.6 % powder OTC - NO INSTRUCTIONS GIVEN      Red Yeast Rice Extract 600 MG CAPS Take 2 capsules by mouth daily.     No current facility-administered medications for this encounter.    REVIEW OF SYSTEMS:  A 10+ POINT REVIEW OF SYSTEMS WAS OBTAINED including neurology, dermatology, psychiatry, cardiac, respiratory, lymph, extremities, GI, GU, musculoskeletal, constitutional, reproductive, HEENT. ***   PHYSICAL EXAM:  vitals were not taken for this visit.   General: Alert and oriented, in no acute distress HEENT: Head is normocephalic. Extraocular movements are intact. Oropharynx is clear. Neck: Neck is supple, no palpable cervical or supraclavicular lymphadenopathy. Heart: Regular in rate and rhythm with no murmurs, rubs, or gallops. Chest: Clear to auscultation bilaterally, with no rhonchi, wheezes, or rales. Abdomen: Soft, nontender, nondistended, with no rigidity or guarding. Extremities: No cyanosis or edema. Lymphatics: see Neck Exam Skin: No concerning lesions. Musculoskeletal: symmetric strength and muscle tone throughout. Neurologic: Cranial nerves II through XII are grossly intact. No obvious focalities. Speech is fluent. Coordination is intact. Psychiatric: Judgment and insight are intact. Affect is appropriate. ***  ECOG = ***  0 - Asymptomatic (Fully active, able to carry on all predisease activities without restriction)  1 - Symptomatic but completely ambulatory (Restricted in physically strenuous activity but ambulatory and able to carry out work of a light or sedentary nature. For example,  light housework, office work)  2 - Symptomatic, <50% in bed during the day (Ambulatory and capable of all self care but unable to carry out any work activities. Up and about more than 50% of waking hours)  3 - Symptomatic, >50% in bed, but not bedbound (Capable of only limited self-care, confined to bed or chair 50% or more of waking hours)  4 - Bedbound (Completely disabled. Cannot carry on any self-care. Totally confined to bed or chair)  5 - Death   Eustace Pen MM, Creech RH, Tormey DC, et al. [phone]). "Toxicity and response criteria of the [facility]". [place] Oncol. 5 (6): 649-55  LABORATORY DATA:  Lab Results  Component Value Date   WBC 9.5 12/21/2016   HGB 15.2 (H) 12/21/2016   HCT 45.6 12/21/2016   MCV 93.7 12/21/2016   PLT 284.0 12/21/2016   NEUTROABS 6.5 12/21/2016   Lab Results  Component Value Date   NA 140 07/25/2012   K 4.1 07/25/2012  CL 105 07/25/2012   CO2 28 07/25/2012   GLUCOSE 106 (H) 07/25/2012   BUN 13 07/25/2012   CREATININE 0.87 07/25/2012   CALCIUM 9.6 07/25/2012      RADIOGRAPHY: No results found.    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  We discussed the natural history of *** and general treatment, highlighting the role of radiotherapy in the management.  We discussed the available radiation techniques, and focused on the details of logistics and delivery.  We reviewed the anticipated acute and late sequelae associated with radiation in this setting.  The patient was encouraged to ask questions that I answered to the best of my ability. *** A patient consent form was discussed and signed.  We retained a copy for our records.  The patient would like to proceed with radiation and will be scheduled for CT simulation.  PLAN: ***    *** minutes of total time was spent for this patient  encounter, including preparation, face-to-face counseling with the patient and coordination of care, physical exam, and documentation of the encounter.   ------------------------------------------------  Blair Promise, PhD, MD  This document serves as a record of services personally performed by Gery Pray, MD. It was created on his behalf by Roney Mans, a trained medical scribe. The creation of this record is based on the scribe's personal observations and the provider's statements to them. This document has been checked and approved by the attending provider.

## 2021-11-07 NOTE — Progress Notes (Signed)
GI Location of Tumor / Histology: left posterior anal lesion - invasive squamous cell carcinoma moderately differentiated   Lisa Nolan presented 2 months ago with symptoms of: seeing blood when she wiped.  Biopsies revealed:   Final Diagnosis    Anal skin tag, anus:              Invasive squamous cell carcinoma,  moderately differentiated  (extending to the deep margin)              The specimen has bee tangentially cut and an adequate measurement of the depth of invasion cannot be obtained.    However, it measures at least 3 mm in depth.    Past/Anticipated interventions by surgeon, if any: subcutaneous fistulotomy 10/25/2021 by Dr. Samule Ohm at [facility].  Past/Anticipated interventions by medical oncology, if any: Saw Dr. Marylou Mccoy at [facility] yesterday - recommending concurrent chemoradiation with 5-FU with mitomycin.  She is being referred to medical oncology at [facility]  Weight changes, if any: has lost 60 lbs over the past 3 years.  Has chenaged her diet and is excercises.  She has also noticed appetite changes.   Bowel/Bladder complaints, if any: no  Nausea / Vomiting, if any: no  Pain issues, if any:  no  Any blood per rectum:  mixture of blood and fecal matter.  She said the wound is still open.  SAFETY ISSUES: Prior radiation? no Pacemaker/ICD? no Possible current pregnancy? no Is the patient on methotrexate? no  Current Complaints/Details: Patient is here with her friend.  BP 139/81 (BP Location: Right Arm, Patient Position: Sitting, Cuff Size: Normal)   Pulse (!) 59   Temp (!) 97.2 F (36.2 C)   Resp 18   Ht '5\' 2"'$  (1.575 m)   Wt 132 lb 3.2 oz (60 kg)   SpO2 99%   BMI 24.18 kg/m

## 2021-11-08 ENCOUNTER — Other Ambulatory Visit: Payer: Self-pay

## 2021-11-08 ENCOUNTER — Ambulatory Visit
Admission: RE | Admit: 2021-11-08 | Discharge: 2021-11-08 | Disposition: A | Discharge status: Home / Self Care | Payer: Medicare (Managed Care) | Source: Ambulatory Visit | Attending: Radiation Oncology | Admitting: Radiation Oncology

## 2021-11-08 ENCOUNTER — Encounter: Payer: Self-pay | Admitting: Radiation Oncology

## 2021-11-08 VITALS — BP 139/81 | HR 59 | Temp 97.2°F | Resp 18 | Ht 62.0 in | Wt 132.2 lb

## 2021-11-08 DIAGNOSIS — G8929 Other chronic pain: Secondary | ICD-10-CM | POA: Diagnosis not present

## 2021-11-08 DIAGNOSIS — Z79899 Other long term (current) drug therapy: Secondary | ICD-10-CM | POA: Diagnosis not present

## 2021-11-08 DIAGNOSIS — E785 Hyperlipidemia, unspecified: Secondary | ICD-10-CM | POA: Diagnosis not present

## 2021-11-08 DIAGNOSIS — C210 Malignant neoplasm of anus, unspecified: Secondary | ICD-10-CM | POA: Diagnosis present

## 2021-11-08 DIAGNOSIS — F17210 Nicotine dependence, cigarettes, uncomplicated: Secondary | ICD-10-CM | POA: Diagnosis not present

## 2021-11-08 DIAGNOSIS — K921 Melena: Secondary | ICD-10-CM | POA: Insufficient documentation

## 2021-11-08 HISTORY — DX: Malignant neoplasm of anus, unspecified: C21.0

## 2021-11-09 ENCOUNTER — Telehealth: Payer: Self-pay | Admitting: Family Medicine

## 2021-11-09 NOTE — Telephone Encounter (Signed)
   Lisa Nolan DOB: [birth year] MRN: [id number]   RIDER WAIVER AND RELEASE OF LIABILITY  For purposes of improving physical access to our facilities, [facility] is pleased to partner with third parties to provide [facility] patients or other authorized individuals the option of convenient, on-demand ground transportation services (the Technical brewer") through use of the technology service that enables users to request on-demand ground transportation from independent third-party providers.  By opting to use and accept these [employer], I, the undersigned, hereby agree on behalf of myself, and on behalf of any minor child using the Government social research officer for whom I am the parent or legal guardian, as follows:  Government social research officer provided to me are provided by independent third-party transportation providers who are not [employer] or employees and who are unaffiliated with [employer]. [facility] is neither a transportation carrier nor a common or public carrier. [facility] has no control over the quality or safety of the transportation that occurs as a result of the [employer]. [facility] cannot guarantee that any third-party transportation provider will complete any arranged transportation service. [facility] makes no representation, warranty, or guarantee regarding the reliability, timeliness, quality, safety, suitability, or availability of any of the Transport Services or that they will be error free. I fully understand that traveling by vehicle involves risks and dangers of serious bodily injury, including permanent disability, paralysis, and death. I agree, on behalf of myself and on behalf of any minor child using the Transport Services for whom I am the parent or legal guardian, that the entire risk arising out of my use of the [employer] remains solely with me, to the maximum extent permitted under applicable law. The [employer] are provided "as  is" and "as available." [facility] disclaims all representations and warranties, express, implied or statutory, not expressly set out in these terms, including the implied warranties of merchantability and fitness for a particular purpose. I hereby waive and release [facility], its agents, employees, officers, directors, representatives, insurers, attorneys, assigns, successors, subsidiaries, and affiliates from any and all past, present, or future claims, demands, liabilities, actions, causes of action, or suits of any kind directly or indirectly arising from acceptance and use of the [employer]. I further waive and release  and its affiliates from all present and future liability and responsibility for any injury or death to persons or damages to property caused by or related to the use of the [employer]. I have read this Waiver and Release of Liability, and I understand the terms used in it and their legal significance. This Waiver is freely and voluntarily given with the understanding that my right (as well as the right of any minor child for whom I am the parent or legal guardian using the [employer]) to legal recourse against  in connection with the [employer] is knowingly surrendered in return for use of these services.   I attest that I read the consent document to Lisa Nolan, gave Ms. Adorno the opportunity to ask questions and answered the questions asked (if any). I affirm that Lisa Nolan then provided consent for she's participation in this program.     Lisa Nolan

## 2021-11-13 NOTE — Progress Notes (Unsigned)
New Hematology/Oncology Consult   Requesting MD: Dr. Gery Pray  [phone]  Reason for Consult: Anal cancer  HPI: Lisa Nolan is a 66 year old woman referred for newly diagnosed anal cancer.  She presented to her PCP in about a month ago for evaluation of a hard anal mass which she had initially noticed about 4 weeks prior.  She was noted to have a 1 cm firm perianal mass with ulceration, left side.  She was referred to general surgery (Dr. 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  She was seen by Dr. Candace Cruise, radiation oncology and Dr. Marylou Mccoy, medical oncology.  She requested referrals to [facility] for radiation oncology and medical oncology to receive treatment closer to her home.  She was seen by Dr. Sondra Come on 11/08/2021 with the plan for 6 weeks of daily radiation along with radiosensitizing chemotherapy.  She has been referred for a PET scan for staging purposes.   Past Medical History:  Diagnosis Date   Allergy    mild   Anal cancer ([place])    Anemia    as child   Anxiety    Chronic back pain    Depression    Hepatitis C    Hyperlipidemia    Personal history of colonic adenoma 09/24/2012     Past Surgical History:  Procedure Laterality Date   COLONOSCOPY     POLYPECTOMY     x2   TONSILLECTOMY  1963     Current Outpatient Medications:    calcium-vitamin D (OSCAL) 250-125 MG-UNIT per tablet, Take 1 tablet by mouth daily. , Disp: , Rfl:    cholecalciferol (VITAMIN D) 1000 UNITS tablet, Take 2,000 Units by mouth daily.  (Patient not taking: Reported on 11/08/2021), Disp: , Rfl:    ezetimibe (ZETIA) 10 MG tablet, Take by mouth., Disp: , Rfl:    MELATONIN PO, Take by mouth., Disp:  , Rfl:    psyllium (METAMUCIL MULTIHEALTH FIBER) 58.6 % powder, OTC - NO INSTRUCTIONS GIVEN , Disp: , Rfl:    rosuvastatin (CRESTOR) 10 MG tablet, Take by mouth., Disp: , Rfl: :     Allergies  Allergen Reactions   Chantix [Varenicline] Other (See Comments)    Made her depressed   Statins     myalgias    FH:  SOCIAL HISTORY:  Review of Systems:  Positives include:  A complete ROS was otherwise negative.   Physical Exam:  There were no vitals taken for this visit.  HEENT: *** Lungs: *** Cardiac: *** Abdomen: *** GU: ***  Vascular: *** Lymph nodes: *** Neurologic: *** Skin: *** Musculoskeletal: ***  LABS:  No results for input(s): "WBC", "HGB", "HCT", "PLT" in the last 72 hours.  No results for input(s): "NA", "K", "CL", "CO2", "GLUCOSE", "BUN", "CREATININE", "CALCIUM" in the last 72 hours.    RADIOLOGY:  No results found.  Assessment and Plan:   ***    Lisa Card, NP 11/13/2021, 12:29 PM

## 2021-11-14 ENCOUNTER — Encounter: Payer: Self-pay | Admitting: *Deleted

## 2021-11-14 ENCOUNTER — Inpatient Hospital Stay: Payer: Medicare (Managed Care) | Attending: Nurse Practitioner | Admitting: Nurse Practitioner

## 2021-11-14 ENCOUNTER — Encounter: Payer: Self-pay | Admitting: Nurse Practitioner

## 2021-11-14 VITALS — BP 133/75 | HR 75 | Temp 98.2°F | Resp 18 | Ht 62.0 in | Wt 129.4 lb

## 2021-11-14 DIAGNOSIS — R911 Solitary pulmonary nodule: Secondary | ICD-10-CM | POA: Diagnosis not present

## 2021-11-14 DIAGNOSIS — C210 Malignant neoplasm of anus, unspecified: Secondary | ICD-10-CM | POA: Diagnosis not present

## 2021-11-14 DIAGNOSIS — F17210 Nicotine dependence, cigarettes, uncomplicated: Secondary | ICD-10-CM | POA: Insufficient documentation

## 2021-11-14 NOTE — Progress Notes (Signed)
Faxed referral order and demographics to [facility] for referral for Dr. Marcello Moores for new diagnosis anal cancer. Noted records are in Epic>

## 2021-11-16 NOTE — Progress Notes (Signed)
Request faxed to [facility] pathology for stained slide for review at GI conference.

## 2021-11-20 ENCOUNTER — Encounter (HOSPITAL_COMMUNITY)
Admission: RE | Admit: 2021-11-20 | Discharge: 2021-11-20 | Disposition: A | Discharge status: Home / Self Care | Payer: Medicare (Managed Care) | Source: Ambulatory Visit | Attending: Radiation Oncology | Admitting: Radiation Oncology

## 2021-11-20 DIAGNOSIS — C210 Malignant neoplasm of anus, unspecified: Secondary | ICD-10-CM | POA: Diagnosis present

## 2021-11-20 DIAGNOSIS — C211 Malignant neoplasm of anal canal: Secondary | ICD-10-CM | POA: Diagnosis not present

## 2021-11-20 LAB — GLUCOSE, CAPILLARY: Glucose-Capillary: 112 mg/dL — ABNORMAL HIGH (ref 70–99)

## 2021-11-20 MED ORDER — FLUDEOXYGLUCOSE F - 18 (FDG) INJECTION
6.2600 | Freq: Once | INTRAVENOUS | Status: AC
  Administered 2021-11-20: 6.26 via INTRAVENOUS

## 2021-11-22 ENCOUNTER — Encounter: Payer: Self-pay | Admitting: Nurse Practitioner

## 2021-11-22 ENCOUNTER — Other Ambulatory Visit: Payer: Self-pay | Admitting: *Deleted

## 2021-11-22 ENCOUNTER — Inpatient Hospital Stay: Payer: Medicare (Managed Care)

## 2021-11-22 ENCOUNTER — Inpatient Hospital Stay: Payer: Medicare (Managed Care) | Admitting: Nurse Practitioner

## 2021-11-22 ENCOUNTER — Encounter: Payer: Self-pay | Admitting: *Deleted

## 2021-11-22 ENCOUNTER — Other Ambulatory Visit: Payer: Self-pay

## 2021-11-22 ENCOUNTER — Inpatient Hospital Stay: Payer: Medicare (Managed Care) | Admitting: Nutrition

## 2021-11-22 VITALS — BP 137/78 | HR 79 | Temp 98.1°F | Resp 20 | Ht 62.0 in | Wt 130.2 lb

## 2021-11-22 DIAGNOSIS — F17210 Nicotine dependence, cigarettes, uncomplicated: Secondary | ICD-10-CM | POA: Diagnosis not present

## 2021-11-22 DIAGNOSIS — C210 Malignant neoplasm of anus, unspecified: Secondary | ICD-10-CM

## 2021-11-22 DIAGNOSIS — R911 Solitary pulmonary nodule: Secondary | ICD-10-CM | POA: Diagnosis not present

## 2021-11-22 LAB — CMP (CANCER CENTER ONLY)
ALT: 20 U/L (ref 0–44)
AST: 28 U/L (ref 15–41)
Albumin: 4.8 g/dL (ref 3.5–5.0)
Alkaline Phosphatase: 63 U/L (ref 38–126)
Anion gap: 9 (ref 5–15)
BUN: 14 mg/dL (ref 8–23)
CO2: 29 mmol/L (ref 22–32)
Calcium: 10 mg/dL (ref 8.9–10.3)
Chloride: 103 mmol/L (ref 98–111)
Creatinine: 0.84 mg/dL (ref 0.44–1.00)
GFR, Estimated: >60 mL/min (ref >60)
Glucose, Bld: 115 mg/dL — ABNORMAL HIGH (ref 70–99)
Potassium: 5 mmol/L (ref 3.5–5.1)
Sodium: 141 mmol/L (ref 135–145)
Total Bilirubin: 0.6 mg/dL (ref 0.3–1.2)
Total Protein: 8.4 g/dL — ABNORMAL HIGH (ref 6.5–8.1)

## 2021-11-22 LAB — CBC WITH DIFFERENTIAL (CANCER CENTER ONLY)
Abs Immature Granulocytes: 0.04 10*3/uL (ref 0.00–0.07)
Basophils Absolute: 0 10*3/uL (ref 0.0–0.1)
Basophils Relative: 0 %
Eosinophils Absolute: 0 10*3/uL (ref 0.0–0.5)
Eosinophils Relative: 0 %
HCT: 44.1 % (ref 36.0–46.0)
Hemoglobin: 14.9 g/dL (ref 12.0–15.0)
Immature Granulocytes: 0 %
Lymphocytes Relative: 16 %
Lymphs Abs: 1.8 10*3/uL (ref 0.7–4.0)
MCH: 31.6 pg (ref 26.0–34.0)
MCHC: 33.8 g/dL (ref 30.0–36.0)
MCV: 93.6 fL (ref 80.0–100.0)
Monocytes Absolute: 0.6 10*3/uL (ref 0.1–1.0)
Monocytes Relative: 5 %
Neutro Abs: 8.8 10*3/uL — ABNORMAL HIGH (ref 1.7–7.7)
Neutrophils Relative %: 79 %
Platelet Count: 270 10*3/uL (ref 150–400)
RBC: 4.71 MIL/uL (ref 3.87–5.11)
RDW: 12.6 % (ref 11.5–15.5)
WBC Count: 11.2 10*3/uL — ABNORMAL HIGH (ref 4.0–10.5)
nRBC: 0 % (ref 0.0–0.2)

## 2021-11-22 NOTE — Progress Notes (Signed)
The proposed treatment discussed in conference is for discussion purpose only and is not a binding recommendation.  The patients have not been physically examined, or presented with their treatment options.  Therefore, final treatment plans cannot be decided.  

## 2021-11-22 NOTE — Progress Notes (Signed)
Referral placed with Social work for new patient with anal cancer

## 2021-11-22 NOTE — Progress Notes (Signed)
66 year old female diagnosed with Anal Cancer followed by Dr. Benay Spice and Dr. Sondra Come. Plan for 5 FU and Mitomycin with concurrent radiation.  PMH includes Anemia, Depression, Hep C and HLD.  Medications include calcium, Vitamin D, Metamucil  Labs reviewed.  Height: 5'2". Weight: 130 lb and 3.2 oz. UBW: 126-133 lb BMI:23.81  Patient has had a decrease in appetite but denies wt loss. Her bowels are moving normally. She has increased anxiety about her diagnosis and treatment. States she eats small amounts of food.  Doesn't eat much meat but loves beans and cheese.  Nutrition Diagnosis: Food and Nutrition Related Knowledge Deficit related to cancer and associated treatments as evidenced by no prior need for nutrition related information.  Intervention:  Educated to consume small, frequent meals and snacks with increased protein,. Discussed snacks. Provided nutrition fact sheets. Briefly reviewed diet strategies for diarrhea. Referral made to Chaplain and SW at patient request. Contact information provided.  Monitoring, Evaluation, Goals: Patient will tolerate adequate calories and protein to minimize wt loss.  Next Visit: To be scheduled as needed.

## 2021-11-22 NOTE — Progress Notes (Signed)
Note for referral placed in error

## 2021-11-22 NOTE — Progress Notes (Signed)
START ON PATHWAY REGIMEN - Anal Carcinoma     One cycle, concurrent with RT:     Fluorouracil      Mitomycin   **Always confirm dose/schedule in your pharmacy ordering system**  Patient Characteristics: Anal Canal Tumors, Newly Diagnosed - Locoregional Disease (Clinical Staging) Therapeutic Status: Newly Diagnosed - Locoregional Disease (Clinical Staging) AJCC T Category: cT1 AJCC N Category: cN0 AJCC M Category: cM0 AJCC 9 Stage Grouping: I Check here if patient was staged using an edition other than AJCC Staging 9th Edition: false Intent of Therapy: Curative Intent, Discussed with Patient 

## 2021-11-22 NOTE — Progress Notes (Signed)
[facility] OFFICE PROGRESS NOTE   Diagnosis:  Anal cancer  INTERVAL HISTORY:   Ms. Lisa Nolan returns as scheduled.  Bowels moving without difficulty.  No bleeding.  She has a good appetite.  No nausea or vomiting.  Some anxiety.  Objective:  Vital signs in last 24 hours:  Blood pressure 137/78, pulse 79, temperature 98.1 F (36.7 C), temperature source Oral, resp. rate 20, height '5\' 2"'$  (1.575 m), weight 130 lb 3.2 oz (59.1 kg), SpO2 100 %.    HEENT: No thrush or ulcers. Resp: Lungs clear bilaterally. Cardio: Regular rate and rhythm. GI: No hepatosplenomegaly. Vascular: No leg edema.  Lab Results:  Lab Results  Component Value Date   WBC 11.2 (H) 11/22/2021   HGB 14.9 11/22/2021   HCT 44.1 11/22/2021   MCV 93.6 11/22/2021   PLT 270 11/22/2021   NEUTROABS 8.8 (H) 11/22/2021    Imaging:  No results found.  Medications: I have reviewed the patient's current medications.  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  No hypermetabolic local regional lymphadenopathy or distant metastatic disease.  Tiny 0.3 cm solid basilar right lower lobe pulmonary nodule below PET resolution. Tobacco use Small right lower lobe pulmonary nodule on PET scan 11/20/2021-noncontrast chest CT in 3 months  Disposition: Lisa Nolan appears stable.  She was recently diagnosed with anal cancer.  We reviewed the recent PET scan result with her at today's visit.  There is no evidence of metastatic disease.  Her case was presented at the GI tumor conference this morning.  Recommendation is to proceed with concurrent radiation/chemotherapy.  We discussed the chemotherapy with Mitomycin-C and  5-fluorouracil.  We reviewed potential toxicities associated with chemotherapy including bone marrow toxicity, nausea, mouth sores, diarrhea, hair loss.  We reviewed hemolytic uremic syndrome associated with mitomycin.  We discussed 5-fluorouracil which will be given as a continuous infusion over 96 hours.  We reviewed the potential for mouth sores, diarrhea, hand-foot syndrome, skin rash, skin hyperpigmentation, increased sensitivity to sun.  She understands a PICC line is required for this regimen.  She agrees to proceed.  She is attending a chemotherapy education class later today.  Radiation simulation is scheduled 11/23/2021.  Anticipated start date for cycle one 5-FU/Mitomycin-C and radiation 12/05/2021.  Plan for placement of a PICC line 12/01/2021.  We discussed the small right lower lobe lung nodule identified on PET scan and the recommendation for a noncontrast chest CT at a 22-monthinterval.  She will return for lab and follow-up on 12/15/2021.  We are available to see her sooner if needed.  Patient seen with Dr. SBenay Spice  LNed CardANP/GNP-BC   11/22/2021  9:54 AM This was a shared visit with LNed Card  We discussed the PET findings and treatment options with Lisa Nolan  Her case was presented at the GI tumor conference earlier today.  We reviewed the biopsy histology.  She has been diagnosed with squamous cell carcinoma of the anus.  The treatment recommendation from the multidisciplinary tumor conference is to proceed with concurrent chemotherapy and radiation.  I recommend treatment with 5-FU/Mitomycin-C and concurrent radiation.  We reviewed potential toxicities associated with this chemotherapy regimen she agrees to proceed.  The plan is to refer her for PICC placement prior to beginning chemotherapy on 12/05/2021.  She  will see Dr. Marcello Moores for anal surveillance.  A chemotherapy plan was entered today.  I was present for greater than 50% of today's visit.  I performed medical  decision making.   Lisa Manson, MD

## 2021-11-22 NOTE — Progress Notes (Signed)
Surgical referral placed with Dr Marcello Moores per Dr Benay Spice

## 2021-11-23 ENCOUNTER — Other Ambulatory Visit: Payer: Self-pay

## 2021-11-23 ENCOUNTER — Inpatient Hospital Stay: Payer: Medicare (Managed Care)

## 2021-11-23 ENCOUNTER — Ambulatory Visit
Admission: RE | Admit: 2021-11-23 | Discharge: 2021-11-23 | Disposition: A | Discharge status: Home / Self Care | Payer: Medicare (Managed Care) | Source: Ambulatory Visit | Attending: Radiation Oncology | Admitting: Radiation Oncology

## 2021-11-23 DIAGNOSIS — C210 Malignant neoplasm of anus, unspecified: Secondary | ICD-10-CM | POA: Diagnosis present

## 2021-11-23 NOTE — Progress Notes (Signed)
[facility] Work  Initial Assessment   Lisa Nolan is a 66 y.o. year old female contacted by phone. Clinical Social Work was referred by  dietitian  for assessment of psychosocial needs.   SDOH (Social Determinants of Health) assessments performed: Yes SDOH Interventions    Flowsheet Row Most Recent Value  SDOH Interventions   Housing Interventions Intervention Not Indicated  Physical Activity Interventions Intervention Not Indicated  Social Connections Interventions Intervention Not Indicated  Transportation Interventions Intervention Not Indicated       SDOH Screenings   Alcohol Screen: Not on file  Depression (HWE9-9): Not on file  Financial Resource Strain: Not on file  Food Insecurity: Not on file  Housing: Low Risk  (11/23/2021)   Housing    Last Housing Risk Score: 0  Physical Activity: Sufficiently Active (11/23/2021)   Exercise Vital Sign    Days of Exercise per Week: 7 days    Minutes of Exercise per Session: 30 min  Social Connections: Socially Isolated (11/23/2021)   Social Connection and Isolation Panel [NHANES]    Frequency of Communication with Friends and Family: More than three times a week    Frequency of Social Gatherings with Friends and Family: Once a week    Attends Religious Services: Never    Marine scientist or Organizations: No    Attends Archivist Meetings: Never    Marital Status: Never married  Stress: Not on file  Tobacco Use: High Risk (11/22/2021)   Patient History    Smoking Tobacco Use: Every Day    Smokeless Tobacco Use: Never    Passive Exposure: Not on file  Transportation Needs: No Transportation Needs (11/23/2021)   PRAPARE - Transportation    Lack of Transportation (Medical): No    Lack of Transportation (Non-Medical): No     Distress Screen completed: No    11/14/2021   11:42 AM  ONCBCN DISTRESS SCREENING  Screening Type Initial Screening  Distress experienced in past week (1-10) 0  Physical  Problem type Sleep/insomnia;Loss of appetitie  Physician notified of physical symptoms No  Referral to clinical psychology No  Referral to clinical social work No  Referral to dietition No  Referral to financial advocate No  Referral to support programs No  Referral to palliative care No      Family/Social Information:  Housing Arrangement: patient lives alone  She has a dog, Roscoe, and a cat that keep her company. Family members/support persons in your life? Family and Friends.  Her sister, Estill Bamberg, lives in [place] and [place] rarely communicates with her.  Their mother is 73 y/o and lives in [place] also.  Bronnie talks to her on the phone every day. 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  Income source: Paediatric nurse concerns: No Type of concern: None Food access concerns: no Religious or spiritual practice: Patient stated she believed in God and does not belong to a church. Services Currently in place:  Medicare.  Coping/ Adjustment to diagnosis: Patient understands treatment plan and what happens next? yes Concerns about diagnosis and/or treatment: Overwhelmed by information.  She said she is so anxious about her diagnosis that she can't drive.  She has several friends through her Nature conservation officer group who are providing transportation.  She said she learned to drive two years ago.  She moved from [place] twelve years ago. Patient reported stressors: Anxiety/ nervousness and Adjusting to my illness Hopes  and/or priorities: Priorities include animals. Patient enjoys  volunteering for Engineer, agricultural. Current coping skills/ strengths: Active sense of humor , Capable of independent living , Communication skills , Financial means , General fund of knowledge , Special hobby/interest , and Supportive family/friends     SUMMARY: Current SDOH Barriers:  Lacks knowledge of community  resources.  Clinical Social Work Clinical Goal(s):  Explore community resource options for unmet needs related to:  Social Connections  Interventions: Discussed common feeling and emotions when being diagnosed with cancer, and the importance of support during treatment Informed patient of the support team roles and support services at [facility] Provided [facility] contact information and encouraged patient to call with any questions or concerns Provided patient with information about CSW and Chaplain roles.   Follow Up Plan: CSW will see patient on 9/15. Patient verbalizes understanding of plan: Yes    Rodman Pickle Neeka Urista, LCSW

## 2021-11-30 ENCOUNTER — Ambulatory Visit (HOSPITAL_COMMUNITY)
Admission: RE | Admit: 2021-11-30 | Discharge: 2021-11-30 | Disposition: A | Discharge status: Home / Self Care | Payer: Medicare (Managed Care) | Source: Ambulatory Visit | Attending: Nurse Practitioner | Admitting: Nurse Practitioner

## 2021-11-30 ENCOUNTER — Other Ambulatory Visit: Payer: Self-pay | Admitting: Nurse Practitioner

## 2021-11-30 DIAGNOSIS — C210 Malignant neoplasm of anus, unspecified: Secondary | ICD-10-CM | POA: Insufficient documentation

## 2021-11-30 MED ORDER — LIDOCAINE HCL 1 % IJ SOLN
INTRAMUSCULAR | Status: AC | PRN
  Administered 2021-11-30: 5 mL

## 2021-11-30 MED ORDER — LIDOCAINE HCL 1 % IJ SOLN
INTRAMUSCULAR | Status: AC
  Filled 2021-11-30: qty 20

## 2021-11-30 MED ORDER — HEPARIN SOD (PORK) LOCK FLUSH 100 UNIT/ML IV SOLN
INTRAVENOUS | Status: AC
  Filled 2021-11-30: qty 5

## 2021-11-30 NOTE — Procedures (Signed)
Successful placement of dual lumen PICC line to right brachial vein. Length 36 cm Tip at lower SVC/RA PICC capped No complications Ready for use.  EBL < 1 mL   Tyson Alias, AGNP 11/30/2021 4:24 PM

## 2021-12-01 ENCOUNTER — Inpatient Hospital Stay: Payer: Medicare (Managed Care) | Attending: Nurse Practitioner

## 2021-12-01 DIAGNOSIS — Z5111 Encounter for antineoplastic chemotherapy: Secondary | ICD-10-CM | POA: Insufficient documentation

## 2021-12-01 DIAGNOSIS — C210 Malignant neoplasm of anus, unspecified: Secondary | ICD-10-CM | POA: Insufficient documentation

## 2021-12-04 ENCOUNTER — Other Ambulatory Visit: Payer: Self-pay | Admitting: Oncology

## 2021-12-04 DIAGNOSIS — C210 Malignant neoplasm of anus, unspecified: Secondary | ICD-10-CM | POA: Diagnosis present

## 2021-12-05 ENCOUNTER — Inpatient Hospital Stay: Payer: Medicare (Managed Care)

## 2021-12-05 ENCOUNTER — Ambulatory Visit: Payer: Medicare (Managed Care)

## 2021-12-05 ENCOUNTER — Ambulatory Visit: Payer: Medicare (Managed Care) | Admitting: Radiation Oncology

## 2021-12-05 VITALS — BP 140/69 | HR 73 | Temp 98.5°F | Resp 20 | Ht 62.0 in | Wt 130.4 lb

## 2021-12-05 DIAGNOSIS — C210 Malignant neoplasm of anus, unspecified: Secondary | ICD-10-CM

## 2021-12-05 DIAGNOSIS — Z5111 Encounter for antineoplastic chemotherapy: Secondary | ICD-10-CM | POA: Diagnosis present

## 2021-12-05 MED ORDER — SODIUM CHLORIDE 0.9 % IV SOLN: Freq: Once | INTRAVENOUS | Status: AC

## 2021-12-05 MED ORDER — SODIUM CHLORIDE 0.9 % IV SOLN
1000.0000 mg/m2/d | INTRAVENOUS | Status: DC
  Administered 2021-12-05: 6450 mg via INTRAVENOUS
  Filled 2021-12-05: qty 129

## 2021-12-05 MED ORDER — MITOMYCIN CHEMO IV INJECTION 20 MG
10.0000 mg/m2 | Freq: Once | INTRAVENOUS | Status: AC
  Administered 2021-12-05: 16 mg via INTRAVENOUS
  Filled 2021-12-05: qty 32

## 2021-12-05 MED ORDER — PROCHLORPERAZINE MALEATE 10 MG PO TABS
10.0000 mg | ORAL_TABLET | Freq: Once | ORAL | Status: AC
  Administered 2021-12-05: 10 mg via ORAL
  Filled 2021-12-05: qty 1

## 2021-12-05 NOTE — Progress Notes (Signed)
Per Dr. Benay Spice: Faythe Ghee to d/c pump infusion at 1:30 pm on Saturday 12/09/2021. Patient made aware of this appointment time and was agreeable.

## 2021-12-05 NOTE — Progress Notes (Signed)
Per Dr. Benay Spice: OK to treat today based on labs from 11/22/21.

## 2021-12-05 NOTE — Patient Instructions (Signed)
Lisa Nolan   Discharge Instructions: The chemotherapy medication bag should finish at 46 hours, 96 hours, or 7 days. For example, if your pump is scheduled for 46 hours and it was put on at 4:00 p.m., it should finish at 2:00 p.m. the day it is scheduled to come off regardless of your appointment time.     Estimated time to finish at 1:30 Saturday December 09, 2021.   If the display on your pump reads "Low Volume" and it is beeping, take the batteries out of the pump and come to the cancer center for it to be taken off.   If the pump alarms go off prior to the pump reading "Low Volume" then call [phone] and someone can assist you.  If the plunger comes out and the chemotherapy medication is leaking out, please use your home chemo spill kit to clean up the spill. Do NOT use paper towels or other household products.  If you have problems or questions regarding your pump, please call either [phone] (24 hours a day) or the cancer center Monday-Friday 8:00 a.m.- 4:30 p.m. at the clinic number and we will assist you. If you are unable to get assistance, then go to the nearest Emergency Department and ask the staff to contact the IV team for assistance. . Thank you for choosing [facility] to provide your oncology and hematology care.   If you have a lab appointment with the [facility], please go directly to the [facility] and check in at the registration area.   Wear comfortable clothing and clothing appropriate for easy access to any Portacath or PICC line.   We strive to give you quality time with your provider. You may need to reschedule your appointment if you arrive late (15 or more minutes).  Arriving late affects you and other patients whose appointments are after yours.  Also, if you miss three or more appointments without notifying the office, you may be dismissed from the clinic at the provider's discretion.      For prescription  refill requests, have your pharmacy contact our office and allow 72 hours for refills to be completed.    Today you received the following chemotherapy and/or immunotherapy agents Mitomycin, Fluorouracil      To help prevent nausea and vomiting after your treatment, we encourage you to take your nausea medication as directed.  BELOW ARE SYMPTOMS THAT SHOULD BE REPORTED IMMEDIATELY: *FEVER GREATER THAN 100.4 F (38 C) OR HIGHER *CHILLS OR SWEATING *NAUSEA AND VOMITING THAT IS NOT CONTROLLED WITH YOUR NAUSEA MEDICATION *UNUSUAL SHORTNESS OF BREATH *UNUSUAL BRUISING OR BLEEDING *URINARY PROBLEMS (pain or burning when urinating, or frequent urination) *BOWEL PROBLEMS (unusual diarrhea, constipation, pain near the anus) TENDERNESS IN MOUTH AND THROAT WITH OR WITHOUT PRESENCE OF ULCERS (sore throat, sores in mouth, or a toothache) UNUSUAL RASH, SWELLING OR PAIN  UNUSUAL VAGINAL DISCHARGE OR ITCHING   Items with * indicate a potential emergency and should be followed up as soon as possible or go to the Emergency Department if any problems should occur.  Please show the CHEMOTHERAPY ALERT CARD or IMMUNOTHERAPY ALERT CARD at check-in to the Emergency Department and triage nurse.  Should you have questions after your visit or need to cancel or reschedule your appointment, please contact [facility]  Dept: [phone]  and follow the prompts.  Office hours are 8:00 a.m. to 4:30 p.m. Monday - Friday. Please note that voicemails left  after 4:00 p.m. may not be returned until the following business day.  We are closed weekends and major holidays. You have access to a nurse at all times for urgent questions. Please call the main number to the clinic Dept: [phone] and follow the prompts.   For any non-urgent questions, you may also contact your provider using MyChart. We now offer e-Visits for anyone 65 and older to request care online for non-urgent symptoms. For details  visit [email or web address].   Also download the MyChart app! Go to the app store, search "MyChart", open the app, select [facility], and log in with your MyChart username and password.  Masks are optional in the cancer centers. If you would like for your care team to wear a mask while they are taking care of you, please let them know. You may have one support person who is at least 67 years old accompany you for your appointments.  Mitomycin Injection What is this medication? MITOMYCIN (mye toe MYE sin) treats stomach cancer and pancreatic cancer. It works by slowing down the growth of cancer cells. This medicine may be used for other purposes; ask your health care provider or pharmacist if you have questions. COMMON BRAND NAME(S): Mutamycin What should I tell my care team before I take this medication? They need to know if you have any of these conditions: Bleeding disorders Infection, such as chickenpox, cold sores, herpes Low blood counts, such as low white cells, platelets, red blood cells Kidney disease An unusual or allergic reaction to mitomycin, other medications, foods, dyes, or preservatives Pregnant or trying to get pregnant Breastfeeding How should I use this medication? This medication is injected into a vein. It is given by your care team in a hospital or clinic setting. Talk to your care team about the use of this medication in children. Special care may be needed. Overdosage: If you think you have taken too much of this medicine contact a poison control center or emergency room at once. NOTE: This medicine is only for you. Do not share this medicine with others. What if I miss a dose? Keep appointments for follow-up doses. It is important not to miss your dose. Call your care team if you are unable to keep an appointment. What may interact with this medication? Interactions are not expected. This list may not describe all possible interactions. Give your health care  provider a list of all the medicines, herbs, non-prescription drugs, or dietary supplements you use. Also tell them if you smoke, drink alcohol, or use illegal drugs. Some items may interact with your medicine. What should I watch for while using this medication? Your condition will be monitored carefully while you are receiving this medication. You may need blood work while taking this medication. This medication may make you feel generally unwell. This is not uncommon as chemotherapy can affect healthy cells as well as cancer cells. Report any side effects. Continue your course of treatment even though you feel ill unless your care team tells you to stop. This medication may increase your risk of getting an infection. Call your care team for advice if you get a fever, chills, sore throat, or other symptoms of a cold or flu. Do not treat yourself. Try to avoid being around people who are sick. Avoid taking medications that contain aspirin, acetaminophen, ibuprofen, naproxen, or ketoprofen unless instructed by your care team. These medications may hide a fever. This medication may increase your risk to bruise or bleed. Call your care  team if you notice any unusual bleeding. Be careful brushing or flossing your teeth or using a toothpick because you may get an infection or bleed more easily. If you have any dental work done, tell your dentist you are receiving this medication. Talk to your care team if you may be pregnant. Serious birth defects can occur if you take this medication during pregnancy. Contraception is recommended while taking this medication. Your care team can help you find the option that works for you. Do not breastfeed while taking this medication. What side effects may I notice from receiving this medication? Side effects that you should report to your care team as soon as possible: Allergic reactions--skin rash, itching, hives, swelling of the face, lips, tongue, or throat Dry cough,  shortness of breath or trouble breathing Infection--fever, chills, cough, sore throat, wounds that don't heal, pain or trouble when passing urine, general feeling of discomfort or being unwell Kidney injury--decrease in the amount of urine, swelling of the ankles, hands, or feet Low red blood cell level--unusual weakness or fatigue, dizziness, headache, trouble breathing Stomach pain, bloody diarrhea, pale skin, unusual weakness or fatigue, decrease in the amount of urine, which may be signs of hemolytic uremic syndrome Unusual bruising or bleeding Side effects that usually do not require medical attention (report these to your care team if they continue or are bothersome): Diarrhea Hair loss Loss of appetite with weight loss Nausea Pain, redness, or swelling with sores inside the mouth or throat This list may not describe all possible side effects. Call your doctor for medical advice about side effects. You may report side effects to FDA at 1-800-FDA-1088. Where should I keep my medication? This medication is given in a hospital or clinic. It will not be stored at home. NOTE: This sheet is a summary. It may not cover all possible information. If you have questions about this medicine, talk to your doctor, pharmacist, or health care provider.  2023 Elsevier/Gold Standard (2021-08-09 00:00:00)  Fluorouracil Injection What is this medication? FLUOROURACIL (flure oh YOOR a sil) treats some types of cancer. It works by slowing down the growth of cancer cells. This medicine may be used for other purposes; ask your health care provider or pharmacist if you have questions. COMMON BRAND NAME(S): Adrucil What should I tell my care team before I take this medication? They need to know if you have any of these conditions: Blood disorders Dihydropyrimidine dehydrogenase (DPD) deficiency Infection, such as chickenpox, cold sores, herpes Kidney disease Liver disease Poor nutrition Recent or ongoing  radiation therapy An unusual or allergic reaction to fluorouracil, other medications, foods, dyes, or preservatives If you or your partner are pregnant or trying to get pregnant Breast-feeding How should I use this medication? This medication is injected into a vein. It is administered by your care team in a hospital or clinic setting. Talk to your care team about the use of this medication in children. Special care may be needed. Overdosage: If you think you have taken too much of this medicine contact a poison control center or emergency room at once. NOTE: This medicine is only for you. Do not share this medicine with others. What if I miss a dose? Keep appointments for follow-up doses. It is important not to miss your dose. Call your care team if you are unable to keep an appointment. What may interact with this medication? Do not take this medication with any of the following: Live virus vaccines This medication may also interact  with the following: Medications that treat or prevent blood clots, such as warfarin, enoxaparin, dalteparin This list may not describe all possible interactions. Give your health care provider a list of all the medicines, herbs, non-prescription drugs, or dietary supplements you use. Also tell them if you smoke, drink alcohol, or use illegal drugs. Some items may interact with your medicine. What should I watch for while using this medication? Your condition will be monitored carefully while you are receiving this medication. This medication may make you feel generally unwell. This is not uncommon as chemotherapy can affect healthy cells as well as cancer cells. Report any side effects. Continue your course of treatment even though you feel ill unless your care team tells you to stop. In some cases, you may be given additional medications to help with side effects. Follow all directions for their use. This medication may increase your risk of getting an infection.  Call your care team for advice if you get a fever, chills, sore throat, or other symptoms of a cold or flu. Do not treat yourself. Try to avoid being around people who are sick. This medication may increase your risk to bruise or bleed. Call your care team if you notice any unusual bleeding. Be careful brushing or flossing your teeth or using a toothpick because you may get an infection or bleed more easily. If you have any dental work done, tell your dentist you are receiving this medication. Avoid taking medications that contain aspirin, acetaminophen, ibuprofen, naproxen, or ketoprofen unless instructed by your care team. These medications may hide a fever. Do not treat diarrhea with over the counter products. Contact your care team if you have diarrhea that lasts more than 2 days or if it is severe and watery. This medication can make you more sensitive to the sun. Keep out of the sun. If you cannot avoid being in the sun, wear protective clothing and sunscreen. Do not use sun lamps, tanning beds, or tanning booths. Talk to your care team if you or your partner wish to become pregnant or think you might be pregnant. This medication can cause serious birth defects if taken during pregnancy and for 3 months after the last dose. A reliable form of contraception is recommended while taking this medication and for 3 months after the last dose. Talk to your care team about effective forms of contraception. Do not father a child while taking this medication and for 3 months after the last dose. Use a condom while having sex during this time period. Do not breastfeed while taking this medication. This medication may cause infertility. Talk to your care team if you are concerned about your fertility. What side effects may I notice from receiving this medication? Side effects that you should report to your care team as soon as possible: Allergic reactions--skin rash, itching, hives, swelling of the face, lips,  tongue, or throat Heart attack--pain or tightness in the chest, shoulders, arms, or jaw, nausea, shortness of breath, cold or clammy skin, feeling faint or lightheaded Heart failure--shortness of breath, swelling of the ankles, feet, or hands, sudden weight gain, unusual weakness or fatigue Heart rhythm changes--fast or irregular heartbeat, dizziness, feeling faint or lightheaded, chest pain, trouble breathing High ammonia level--unusual weakness or fatigue, confusion, loss of appetite, nausea, vomiting, seizures Infection--fever, chills, cough, sore throat, wounds that don't heal, pain or trouble when passing urine, general feeling of discomfort or being unwell Low red blood cell level--unusual weakness or fatigue, dizziness, headache, trouble breathing  Pain, tingling, or numbness in the hands or feet, muscle weakness, change in vision, confusion or trouble speaking, loss of balance or coordination, trouble walking, seizures Redness, swelling, and blistering of the skin over hands and feet Severe or prolonged diarrhea Unusual bruising or bleeding Side effects that usually do not require medical attention (report to your care team if they continue or are bothersome): Dry skin Headache Increased tears Nausea Pain, redness, or swelling with sores inside the mouth or throat Sensitivity to light Vomiting This list may not describe all possible side effects. Call your doctor for medical advice about side effects. You may report side effects to FDA at 1-800-FDA-1088. Where should I keep my medication? This medication is given in a hospital or clinic. It will not be stored at home. NOTE: This sheet is a summary. It may not cover all possible information. If you have questions about this medicine, talk to your doctor, pharmacist, or health care provider.  2023 Elsevier/Gold Standard (2021-07-25 00:00:00)

## 2021-12-06 ENCOUNTER — Ambulatory Visit
Admission: RE | Admit: 2021-12-06 | Discharge: 2021-12-06 | Disposition: A | Discharge status: Home / Self Care | Payer: Medicare (Managed Care) | Source: Ambulatory Visit | Attending: Radiation Oncology | Admitting: Radiation Oncology

## 2021-12-06 ENCOUNTER — Inpatient Hospital Stay: Payer: Medicare (Managed Care)

## 2021-12-06 ENCOUNTER — Telehealth: Payer: Self-pay | Admitting: Emergency Medicine

## 2021-12-06 ENCOUNTER — Other Ambulatory Visit: Payer: Self-pay

## 2021-12-06 ENCOUNTER — Other Ambulatory Visit: Payer: Medicare (Managed Care)

## 2021-12-06 DIAGNOSIS — C210 Malignant neoplasm of anus, unspecified: Secondary | ICD-10-CM | POA: Diagnosis not present

## 2021-12-06 LAB — RAD ONC ARIA SESSION SUMMARY
Course Elapsed Days: 0
Plan Fractions Treated to Date: 1
Plan Prescribed Dose Per Fraction: 1.8 Gy
Plan Total Fractions Prescribed: 28
Plan Total Prescribed Dose: 50.4 Gy
Reference Point Dosage Given to Date: 1.8 Gy
Reference Point Session Dosage Given: 1.8 Gy
Session Number: 1

## 2021-12-06 NOTE — Telephone Encounter (Signed)
24 Hour Callback 24 Hour Callback post 1st time Mitomycin and 5FU pump.  Pt reports she is doing well. No N/V/D. Patient is eating and drinking well.  Pt did note that her pump keeps beeping, reading high pressure. She called infusystem and they told her it was probably kinked. Pt noted that it was kinked at the top. Pt is going to [facility] today for radiation today and was advised at check in to ask that someone look at her pump. Pt advised to call with any questions or concerns.

## 2021-12-07 ENCOUNTER — Ambulatory Visit
Admission: RE | Admit: 2021-12-07 | Discharge: 2021-12-07 | Disposition: A | Discharge status: Home / Self Care | Payer: Medicare (Managed Care) | Source: Ambulatory Visit | Attending: Radiation Oncology | Admitting: Radiation Oncology

## 2021-12-07 ENCOUNTER — Other Ambulatory Visit: Payer: Self-pay

## 2021-12-07 DIAGNOSIS — C210 Malignant neoplasm of anus, unspecified: Secondary | ICD-10-CM | POA: Diagnosis not present

## 2021-12-07 LAB — RAD ONC ARIA SESSION SUMMARY
Course Elapsed Days: 1
Plan Fractions Treated to Date: 2
Plan Prescribed Dose Per Fraction: 1.8 Gy
Plan Total Fractions Prescribed: 28
Plan Total Prescribed Dose: 50.4 Gy
Reference Point Dosage Given to Date: 3.6 Gy
Reference Point Session Dosage Given: 1.8 Gy
Session Number: 2

## 2021-12-07 NOTE — Progress Notes (Signed)
Pt called noting that her home infusion pump had completely disconnected from her PICC line. Pt stated that some chemo spilled at her home and she tried to use her spill kit. Pt unable to clamp PICC line as she states she does not have  enough strength in her hand. She did call the pump number on the side and they told her how to stop the pump. Pt noted she did not have any transportation to get to the infusion room. Transportation set up. Pt arrived to cancer center with chemo line disconnected from PICC. Blue phaseal cover intact- appears small phaseal connector at PICC disconnected, small phaseal intact with no apparent damage. PICC cap intact with no apparent damage. Pt unsure when this occurred, was seen at [facility] today for radiation. Pt noted she felt something wet earlier in the morning. This RN contacted Dr Burr Medico as instructed by Artemio Aly. As Dr Benay Spice is on PAL. Per Dr Burr Medico, estimate total volume still in bag (which was ~130cc), have pharmacy make that amount and connect to patient tomorrow (12/08/21) morning and slow infusion to complete on Monday (12/11/2021). Remaining 130cc of chemo discarded in black bin. Appt for 12/08/21 created for after pt radiation appt, patient aware and agrees. Pt PICC line flushed with brisk BR noted and heparin locked. Dressing for PICC c/d/I. Returning trip home for 12/07/21 transportation set up, pt ambulatory to lobby.

## 2021-12-08 ENCOUNTER — Other Ambulatory Visit: Payer: Self-pay

## 2021-12-08 ENCOUNTER — Inpatient Hospital Stay: Payer: Medicare (Managed Care)

## 2021-12-08 ENCOUNTER — Ambulatory Visit
Admission: RE | Admit: 2021-12-08 | Discharge: 2021-12-08 | Disposition: A | Discharge status: Home / Self Care | Payer: Medicare (Managed Care) | Source: Ambulatory Visit | Attending: Radiation Oncology | Admitting: Radiation Oncology

## 2021-12-08 ENCOUNTER — Other Ambulatory Visit: Payer: Self-pay | Admitting: Hematology

## 2021-12-08 DIAGNOSIS — C210 Malignant neoplasm of anus, unspecified: Secondary | ICD-10-CM | POA: Diagnosis not present

## 2021-12-08 DIAGNOSIS — Z5111 Encounter for antineoplastic chemotherapy: Secondary | ICD-10-CM | POA: Diagnosis not present

## 2021-12-08 LAB — RAD ONC ARIA SESSION SUMMARY
Course Elapsed Days: 2
Plan Fractions Treated to Date: 3
Plan Prescribed Dose Per Fraction: 1.8 Gy
Plan Total Fractions Prescribed: 28
Plan Total Prescribed Dose: 50.4 Gy
Reference Point Dosage Given to Date: 5.4 Gy
Reference Point Session Dosage Given: 1.8 Gy
Session Number: 3

## 2021-12-08 MED ORDER — SODIUM CHLORIDE 0.9 % IV SOLN
3382.0000 mg | Freq: Once | INTRAVENOUS | Status: DC
  Administered 2021-12-08: 3382 mg via INTRAVENOUS
  Filled 2021-12-08: qty 67.64

## 2021-12-08 NOTE — Patient Instructions (Signed)
Fluorouracil Injection What is this medication? FLUOROURACIL (flure oh YOOR a sil) treats some types of cancer. It works by slowing down the growth of cancer cells. This medicine may be used for other purposes; ask your health care provider or pharmacist if you have questions. COMMON BRAND NAME(S): Adrucil What should I tell my care team before I take this medication? They need to know if you have any of these conditions: Blood disorders Dihydropyrimidine dehydrogenase (DPD) deficiency Infection, such as chickenpox, cold sores, herpes Kidney disease Liver disease Poor nutrition Recent or ongoing radiation therapy An unusual or allergic reaction to fluorouracil, other medications, foods, dyes, or preservatives If you or your partner are pregnant or trying to get pregnant Breast-feeding How should I use this medication? This medication is injected into a vein. It is administered by your care team in a hospital or clinic setting. Talk to your care team about the use of this medication in children. Special care may be needed. Overdosage: If you think you have taken too much of this medicine contact a poison control center or emergency room at once. NOTE: This medicine is only for you. Do not share this medicine with others. What if I miss a dose? Keep appointments for follow-up doses. It is important not to miss your dose. Call your care team if you are unable to keep an appointment. What may interact with this medication? Do not take this medication with any of the following: Live virus vaccines This medication may also interact with the following: Medications that treat or prevent blood clots, such as warfarin, enoxaparin, dalteparin This list may not describe all possible interactions. Give your health care provider a list of all the medicines, herbs, non-prescription drugs, or dietary supplements you use. Also tell them if you smoke, drink alcohol, or use illegal drugs. Some items may  interact with your medicine. What should I watch for while using this medication? Your condition will be monitored carefully while you are receiving this medication. This medication may make you feel generally unwell. This is not uncommon as chemotherapy can affect healthy cells as well as cancer cells. Report any side effects. Continue your course of treatment even though you feel ill unless your care team tells you to stop. In some cases, you may be given additional medications to help with side effects. Follow all directions for their use. This medication may increase your risk of getting an infection. Call your care team for advice if you get a fever, chills, sore throat, or other symptoms of a cold or flu. Do not treat yourself. Try to avoid being around people who are sick. This medication may increase your risk to bruise or bleed. Call your care team if you notice any unusual bleeding. Be careful brushing or flossing your teeth or using a toothpick because you may get an infection or bleed more easily. If you have any dental work done, tell your dentist you are receiving this medication. Avoid taking medications that contain aspirin, acetaminophen, ibuprofen, naproxen, or ketoprofen unless instructed by your care team. These medications may hide a fever. Do not treat diarrhea with over the counter products. Contact your care team if you have diarrhea that lasts more than 2 days or if it is severe and watery. This medication can make you more sensitive to the sun. Keep out of the sun. If you cannot avoid being in the sun, wear protective clothing and sunscreen. Do not use sun lamps, tanning beds, or tanning booths. Talk to   your care team if you or your partner wish to become pregnant or think you might be pregnant. This medication can cause serious birth defects if taken during pregnancy and for 3 months after the last dose. A reliable form of contraception is recommended while taking this  medication and for 3 months after the last dose. Talk to your care team about effective forms of contraception. Do not father a child while taking this medication and for 3 months after the last dose. Use a condom while having sex during this time period. Do not breastfeed while taking this medication. This medication may cause infertility. Talk to your care team if you are concerned about your fertility. What side effects may I notice from receiving this medication? Side effects that you should report to your care team as soon as possible: Allergic reactions--skin rash, itching, hives, swelling of the face, lips, tongue, or throat Heart attack--pain or tightness in the chest, shoulders, arms, or jaw, nausea, shortness of breath, cold or clammy skin, feeling faint or lightheaded Heart failure--shortness of breath, swelling of the ankles, feet, or hands, sudden weight gain, unusual weakness or fatigue Heart rhythm changes--fast or irregular heartbeat, dizziness, feeling faint or lightheaded, chest pain, trouble breathing High ammonia level--unusual weakness or fatigue, confusion, loss of appetite, nausea, vomiting, seizures Infection--fever, chills, cough, sore throat, wounds that don't heal, pain or trouble when passing urine, general feeling of discomfort or being unwell Low red blood cell level--unusual weakness or fatigue, dizziness, headache, trouble breathing Pain, tingling, or numbness in the hands or feet, muscle weakness, change in vision, confusion or trouble speaking, loss of balance or coordination, trouble walking, seizures Redness, swelling, and blistering of the skin over hands and feet Severe or prolonged diarrhea Unusual bruising or bleeding Side effects that usually do not require medical attention (report to your care team if they continue or are bothersome): Dry skin Headache Increased tears Nausea Pain, redness, or swelling with sores inside the mouth or throat Sensitivity  to light Vomiting This list may not describe all possible side effects. Call your doctor for medical advice about side effects. You may report side effects to FDA at 1-800-FDA-1088. Where should I keep my medication? This medication is given in a hospital or clinic. It will not be stored at home. NOTE: This sheet is a summary. It may not cover all possible information. If you have questions about this medicine, talk to your doctor, pharmacist, or health care provider.  2023 Elsevier/Gold Standard (2021-07-25 00:00:00)  

## 2021-12-09 ENCOUNTER — Inpatient Hospital Stay: Payer: Medicare (Managed Care)

## 2021-12-11 ENCOUNTER — Other Ambulatory Visit: Payer: Self-pay

## 2021-12-11 ENCOUNTER — Inpatient Hospital Stay: Payer: Medicare (Managed Care)

## 2021-12-11 ENCOUNTER — Ambulatory Visit
Admission: RE | Admit: 2021-12-11 | Discharge: 2021-12-11 | Disposition: A | Discharge status: Home / Self Care | Payer: Medicare (Managed Care) | Source: Ambulatory Visit | Attending: Radiation Oncology | Admitting: Radiation Oncology

## 2021-12-11 VITALS — BP 126/71 | HR 61 | Resp 17

## 2021-12-11 DIAGNOSIS — C210 Malignant neoplasm of anus, unspecified: Secondary | ICD-10-CM

## 2021-12-11 DIAGNOSIS — Z5111 Encounter for antineoplastic chemotherapy: Secondary | ICD-10-CM | POA: Diagnosis not present

## 2021-12-11 LAB — RAD ONC ARIA SESSION SUMMARY
Course Elapsed Days: 5
Plan Fractions Treated to Date: 4
Plan Prescribed Dose Per Fraction: 1.8 Gy
Plan Total Fractions Prescribed: 28
Plan Total Prescribed Dose: 50.4 Gy
Reference Point Dosage Given to Date: 7.2 Gy
Reference Point Session Dosage Given: 1.8 Gy
Session Number: 4

## 2021-12-11 MED ORDER — HEPARIN SOD (PORK) LOCK FLUSH 100 UNIT/ML IV SOLN
250.0000 [IU] | Freq: Once | INTRAVENOUS | Status: AC | PRN
  Administered 2021-12-11: 250 [IU]

## 2021-12-11 MED ORDER — SODIUM CHLORIDE 0.9% FLUSH
10.0000 mL | INTRAVENOUS | Status: DC | PRN
  Administered 2021-12-11: 10 mL

## 2021-12-11 NOTE — Patient Instructions (Signed)
PICC Removal, Adult  A peripherally inserted central catheter (PICC) is a form of IV access that allows medicines and IV fluids to be quickly put into the blood and spread throughout the body. The PICC is a long, thin, flexible tube (catheter) that is put into a vein in a person's arm or leg. The PICC is guided through the vein until the tip or end of it is in a large vein called the superior vena cava (SVC) just outside the heart. A PICC may be removed if it is no longer needed, if it causes infection, or if it is not working properly. Taking out a PICC is usually a painless procedure. Only a trained health care provider should do it. Do not try to remove a PICC yourself. Tell your health care provider about: Any allergies you have. All medicines you are taking, including vitamins, herbs, eye drops, creams, and over-the-counter medicines. Any problems you or family members have had with anesthetic medicines. Any bleeding problems you have. Any surgeries you have had. Any medical conditions you have. Whether you are pregnant or may be pregnant. What are the risks? Generally, this is a safe procedure. However, problems may occur, including: Bleeding. Infection. A break in the PICC. Vein blockage caused by an air bubble (air embolism). What happens before the procedure? Discuss PICC removal with your health care team. You will need an order from your health care provider to have your PICC removed. Follow instructions from your health care provider about eating or drinking restrictions. In most cases, special preparation is not needed. Ask your health care provider about: Changing or stopping your regular medicines. This is especially important if you are taking diabetes medicines or blood thinners. Taking medicines such as aspirin and ibuprofen. These medicines can thin your blood. Do not take these medicines unless your health care provider tells you to take them. Taking over-the-counter  medicines, vitamins, herbs, and supplements. Plan to have a responsible adult care for you for the time you are told after you leave the hospital or clinic. This is important. What happens during the procedure? You will lie down flat. Your head may be positioned slightly lower than your heart. You may be told to keep as still as possible. The bandage (dressing) over your PICC will be removed. The place where the PICC exits the body (exit site) will be cleaned with a germ-killing (antiseptic) solution. A germ-free (sterile) gauze pad will be held gently over the exit site. Your health care provider will pull out the PICC slowly and steadily. You may be asked to hold your breath or exhale while this is done. After the PICC is removed, the health care provider will gently press on the exit site for about 5 minutes. Antibiotic or petroleum-based ointment will be put on the exit site. The exit site will be covered with an airtight (occlusive) sterile dressing or another type of dressing. The PICC will be closely checked to make sure that the entire tube has been removed. Part of the PICC may be tested for bacteria. The procedure may vary among health care providers and hospitals. What happens after the procedure? You may need to stay lying down for a period of time. You will be monitored to make sure that: No blood or fluid is draining from the exit site. There are no signs of an air embolism. Summary A PICC may be removed if it is no longer needed, if it causes infection, or if it is not working properly.   Only a trained health care provider should do this procedure. Do not try to remove a PICC yourself. Generally, this is a safe procedure. However, problems may occur, including bleeding, infection, or an air bubble (air embolism). After the procedure, you will be monitored to make sure that no blood or fluid is draining from the site and that there are no signs of an air embolism. This information  is not intended to replace advice given to you by your health care provider. Make sure you discuss any questions you have with your health care provider. Document Revised: 10/05/2020 Document Reviewed: 10/05/2020 Elsevier Patient Education  2023 [employer]vier Inc.  

## 2021-12-11 NOTE — Progress Notes (Signed)
Pt here for pump d/c and PICC removal. Pt tolerated well. Observed for 30 min post removal. Discharged with VSS, dressing c/d/I. Pt aware of signs and symptoms to be aware of, AVS reviewed. Pt ambulatory to lobby upon discharge.

## 2021-12-12 ENCOUNTER — Ambulatory Visit
Admission: RE | Admit: 2021-12-12 | Discharge: 2021-12-12 | Disposition: A | Discharge status: Home / Self Care | Payer: Medicare (Managed Care) | Source: Ambulatory Visit | Attending: Radiation Oncology | Admitting: Radiation Oncology

## 2021-12-12 ENCOUNTER — Inpatient Hospital Stay: Payer: Medicare (Managed Care)

## 2021-12-12 ENCOUNTER — Ambulatory Visit: Payer: Medicare (Managed Care)

## 2021-12-12 ENCOUNTER — Other Ambulatory Visit: Payer: Self-pay

## 2021-12-12 DIAGNOSIS — C210 Malignant neoplasm of anus, unspecified: Secondary | ICD-10-CM | POA: Diagnosis not present

## 2021-12-12 LAB — RAD ONC ARIA SESSION SUMMARY
Course Elapsed Days: 6
Plan Fractions Treated to Date: 5
Plan Prescribed Dose Per Fraction: 1.8 Gy
Plan Total Fractions Prescribed: 28
Plan Total Prescribed Dose: 50.4 Gy
Reference Point Dosage Given to Date: 9 Gy
Reference Point Session Dosage Given: 1.8 Gy
Session Number: 5

## 2021-12-12 NOTE — Progress Notes (Signed)
Pt here for patient teaching.    Pt given Radiation and You booklet and skin care instructions.    Reviewed areas of pertinence such as diarrhea, fatigue, hair loss in treatment field, nausea and vomiting, sexual and fertility changes, skin changes, and urinary and bladder changes .   Pt able to give teach back of to pat skin, use unscented/gentle soap, use baby wipes, have Imodium on hand, drink plenty of water, and sitz bath,avoid applying anything to skin within 4 hours of treatment.   Pt verbalizes understanding of information given and will contact nursing with any questions or concerns.

## 2021-12-13 ENCOUNTER — Other Ambulatory Visit: Payer: Self-pay

## 2021-12-13 ENCOUNTER — Inpatient Hospital Stay: Payer: Medicare (Managed Care)

## 2021-12-13 ENCOUNTER — Ambulatory Visit
Admission: RE | Admit: 2021-12-13 | Discharge: 2021-12-13 | Disposition: A | Discharge status: Home / Self Care | Payer: Medicare (Managed Care) | Source: Ambulatory Visit | Attending: Radiation Oncology | Admitting: Radiation Oncology

## 2021-12-13 DIAGNOSIS — C210 Malignant neoplasm of anus, unspecified: Secondary | ICD-10-CM | POA: Diagnosis not present

## 2021-12-13 LAB — RAD ONC ARIA SESSION SUMMARY
Course Elapsed Days: 7
Plan Fractions Treated to Date: 6
Plan Prescribed Dose Per Fraction: 1.8 Gy
Plan Total Fractions Prescribed: 28
Plan Total Prescribed Dose: 50.4 Gy
Reference Point Dosage Given to Date: 10.8 Gy
Reference Point Session Dosage Given: 1.8 Gy
Session Number: 6

## 2021-12-14 ENCOUNTER — Inpatient Hospital Stay: Payer: Medicare (Managed Care)

## 2021-12-14 ENCOUNTER — Other Ambulatory Visit: Payer: Self-pay

## 2021-12-14 ENCOUNTER — Ambulatory Visit
Admission: RE | Admit: 2021-12-14 | Discharge: 2021-12-14 | Disposition: A | Discharge status: Home / Self Care | Payer: Medicare (Managed Care) | Source: Ambulatory Visit | Attending: Radiation Oncology | Admitting: Radiation Oncology

## 2021-12-14 DIAGNOSIS — C210 Malignant neoplasm of anus, unspecified: Secondary | ICD-10-CM | POA: Diagnosis not present

## 2021-12-14 LAB — RAD ONC ARIA SESSION SUMMARY
Course Elapsed Days: 8
Plan Fractions Treated to Date: 7
Plan Prescribed Dose Per Fraction: 1.8 Gy
Plan Total Fractions Prescribed: 28
Plan Total Prescribed Dose: 50.4 Gy
Reference Point Dosage Given to Date: 12.6 Gy
Reference Point Session Dosage Given: 1.8 Gy
Session Number: 7

## 2021-12-15 ENCOUNTER — Inpatient Hospital Stay: Payer: Medicare (Managed Care)

## 2021-12-15 ENCOUNTER — Encounter: Payer: Self-pay | Admitting: Nurse Practitioner

## 2021-12-15 ENCOUNTER — Other Ambulatory Visit: Payer: Self-pay

## 2021-12-15 ENCOUNTER — Ambulatory Visit
Admission: RE | Admit: 2021-12-15 | Discharge: 2021-12-15 | Disposition: A | Discharge status: Home / Self Care | Payer: Medicare (Managed Care) | Source: Ambulatory Visit | Attending: Radiation Oncology | Admitting: Radiation Oncology

## 2021-12-15 ENCOUNTER — Inpatient Hospital Stay: Payer: Medicare (Managed Care) | Admitting: Nurse Practitioner

## 2021-12-15 VITALS — BP 131/74 | HR 59 | Temp 98.1°F | Resp 18 | Ht 62.0 in | Wt 130.0 lb

## 2021-12-15 DIAGNOSIS — C210 Malignant neoplasm of anus, unspecified: Secondary | ICD-10-CM

## 2021-12-15 DIAGNOSIS — Z5111 Encounter for antineoplastic chemotherapy: Secondary | ICD-10-CM | POA: Diagnosis not present

## 2021-12-15 LAB — RAD ONC ARIA SESSION SUMMARY
Course Elapsed Days: 9
Plan Fractions Treated to Date: 8
Plan Prescribed Dose Per Fraction: 1.8 Gy
Plan Total Fractions Prescribed: 28
Plan Total Prescribed Dose: 50.4 Gy
Reference Point Dosage Given to Date: 14.4 Gy
Reference Point Session Dosage Given: 1.8 Gy
Session Number: 8

## 2021-12-15 LAB — CBC WITH DIFFERENTIAL (CANCER CENTER ONLY)
Abs Immature Granulocytes: 0.03 10*3/uL (ref 0.00–0.07)
Basophils Absolute: 0 10*3/uL (ref 0.0–0.1)
Basophils Relative: 0 %
Eosinophils Absolute: 0 10*3/uL (ref 0.0–0.5)
Eosinophils Relative: 1 %
HCT: 40.8 % (ref 36.0–46.0)
Hemoglobin: 13.8 g/dL (ref 12.0–15.0)
Immature Granulocytes: 1 %
Lymphocytes Relative: 18 %
Lymphs Abs: 0.8 10*3/uL (ref 0.7–4.0)
MCH: 31.7 pg (ref 26.0–34.0)
MCHC: 33.8 g/dL (ref 30.0–36.0)
MCV: 93.6 fL (ref 80.0–100.0)
Monocytes Absolute: 0.3 10*3/uL (ref 0.1–1.0)
Monocytes Relative: 6 %
Neutro Abs: 3.4 10*3/uL (ref 1.7–7.7)
Neutrophils Relative %: 74 %
Platelet Count: 176 10*3/uL (ref 150–400)
RBC: 4.36 MIL/uL (ref 3.87–5.11)
RDW: 12.1 % (ref 11.5–15.5)
WBC Count: 4.5 10*3/uL (ref 4.0–10.5)
nRBC: 0 % (ref 0.0–0.2)

## 2021-12-15 LAB — CMP (CANCER CENTER ONLY)
ALT: 19 U/L (ref 0–44)
AST: 25 U/L (ref 15–41)
Albumin: 4.5 g/dL (ref 3.5–5.0)
Alkaline Phosphatase: 63 U/L (ref 38–126)
Anion gap: 9 (ref 5–15)
BUN: 18 mg/dL (ref 8–23)
CO2: 29 mmol/L (ref 22–32)
Calcium: 9.8 mg/dL (ref 8.9–10.3)
Chloride: 104 mmol/L (ref 98–111)
Creatinine: 0.69 mg/dL (ref 0.44–1.00)
GFR, Estimated: >60 mL/min (ref >60)
Glucose, Bld: 110 mg/dL — ABNORMAL HIGH (ref 70–99)
Potassium: 4.4 mmol/L (ref 3.5–5.1)
Sodium: 142 mmol/L (ref 135–145)
Total Bilirubin: 0.5 mg/dL (ref 0.3–1.2)
Total Protein: 7.3 g/dL (ref 6.5–8.1)

## 2021-12-15 NOTE — Progress Notes (Signed)
  [facility] OFFICE PROGRESS NOTE   Diagnosis: Anal cancer  INTERVAL HISTORY:   Lisa Nolan returns as scheduled.  She completed cycle one 5-FU/Mitomycin-C beginning 12/05/2021.  She began radiation on 12/06/21.  The pump disconnected from the PICC line 12/08/2021.  Remaining volume was calculated and infused until 12/11/2021.  She denies nausea/vomiting.  She thinks she noticed a mouth sore this morning.  She had a loose stool yesterday.  She took Imodium.  No hand or foot pain or redness.  She notes a "burning" sensation at the anus.  She has noticed mild intermittent numbness in the toes over the past several weeks, predated initiation of treatment.  Objective:  Vital signs in last 24 hours:  Blood pressure 131/74, pulse (!) 59, temperature 98.1 F (36.7 C), temperature source Oral, resp. rate 18, height '5\' 2"'$  (1.575 m), weight 130 lb (59 kg), SpO2 99 %.    HEENT: Single, small superficial ulceration left lower anterior buccal mucosa/gumline. Resp: Lungs clear bilaterally. Cardio: Regular rate and rhythm. GI: Abdomen soft and nontender.  No hepatosplenomegaly.  Perianal skin is intact, no erythema or skin breakdown. Vascular: No leg edema. Skin: Palms without erythema.   Lab Results:  Lab Results  Component Value Date   WBC 4.5 12/15/2021   HGB 13.8 12/15/2021   HCT 40.8 12/15/2021   MCV 93.6 12/15/2021   PLT 176 12/15/2021   NEUTROABS 3.4 12/15/2021    Imaging:  No results found.  Medications: I have reviewed the patient's current medications.  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  No hypermetabolic local regional lymphadenopathy or distant metastatic  disease.  Tiny 0.3 cm solid basilar right lower lobe pulmonary nodule below PET resolution. Cycle one 5-FU/Mitomycin-C beginning 12/05/2021 Radiation beginning 12/06/2021 Tobacco use Small right lower lobe pulmonary nodule on PET scan 11/20/2021-noncontrast chest CT in 3 months  Disposition: Lisa Nolan appears stable.  She completed cycle one 5-FU/Mitomycin-C beginning 12/05/2021.  Overall she seems to have tolerated well.  She has a single mouth sore.  We discussed applying Orajel with a Q-tip if the area is particularly painful.  CBC and chemistry panel reviewed.  Her labs look good.  She will return for lab, follow-up, cycle two 5-FU/Mitomycin-C on 01/01/2022.  Plan for PICC line placement 12/29/2021.  She will contact the office in the interim with any problems.    Ned Card ANP/GNP-BC   12/15/2021  11:20 AM

## 2021-12-18 ENCOUNTER — Other Ambulatory Visit: Payer: Self-pay

## 2021-12-18 ENCOUNTER — Ambulatory Visit
Admission: RE | Admit: 2021-12-18 | Discharge: 2021-12-18 | Disposition: A | Discharge status: Home / Self Care | Payer: Medicare (Managed Care) | Source: Ambulatory Visit | Attending: Radiation Oncology | Admitting: Radiation Oncology

## 2021-12-18 ENCOUNTER — Inpatient Hospital Stay: Payer: Medicare (Managed Care)

## 2021-12-18 DIAGNOSIS — C210 Malignant neoplasm of anus, unspecified: Secondary | ICD-10-CM | POA: Diagnosis not present

## 2021-12-18 LAB — RAD ONC ARIA SESSION SUMMARY
Course Elapsed Days: 12
Plan Fractions Treated to Date: 9
Plan Prescribed Dose Per Fraction: 1.8 Gy
Plan Total Fractions Prescribed: 28
Plan Total Prescribed Dose: 50.4 Gy
Reference Point Dosage Given to Date: 16.2 Gy
Reference Point Session Dosage Given: 1.8 Gy
Session Number: 9

## 2021-12-19 ENCOUNTER — Inpatient Hospital Stay: Payer: Medicare (Managed Care)

## 2021-12-19 ENCOUNTER — Ambulatory Visit
Admission: RE | Admit: 2021-12-19 | Discharge: 2021-12-19 | Disposition: A | Discharge status: Home / Self Care | Payer: Medicare (Managed Care) | Source: Ambulatory Visit | Attending: Radiation Oncology | Admitting: Radiation Oncology

## 2021-12-19 ENCOUNTER — Other Ambulatory Visit: Payer: Self-pay

## 2021-12-19 DIAGNOSIS — C210 Malignant neoplasm of anus, unspecified: Secondary | ICD-10-CM | POA: Diagnosis not present

## 2021-12-19 LAB — RAD ONC ARIA SESSION SUMMARY
Course Elapsed Days: 13
Plan Fractions Treated to Date: 10
Plan Prescribed Dose Per Fraction: 1.8 Gy
Plan Total Fractions Prescribed: 28
Plan Total Prescribed Dose: 50.4 Gy
Reference Point Dosage Given to Date: 18 Gy
Reference Point Session Dosage Given: 1.8 Gy
Session Number: 10

## 2021-12-20 ENCOUNTER — Other Ambulatory Visit: Payer: Self-pay

## 2021-12-20 ENCOUNTER — Inpatient Hospital Stay: Payer: Medicare (Managed Care)

## 2021-12-20 ENCOUNTER — Ambulatory Visit
Admission: RE | Admit: 2021-12-20 | Discharge: 2021-12-20 | Disposition: A | Discharge status: Home / Self Care | Payer: Medicare (Managed Care) | Source: Ambulatory Visit | Attending: Radiation Oncology | Admitting: Radiation Oncology

## 2021-12-20 DIAGNOSIS — C210 Malignant neoplasm of anus, unspecified: Secondary | ICD-10-CM | POA: Diagnosis not present

## 2021-12-20 LAB — RAD ONC ARIA SESSION SUMMARY
Course Elapsed Days: 14
Plan Fractions Treated to Date: 11
Plan Prescribed Dose Per Fraction: 1.8 Gy
Plan Total Fractions Prescribed: 28
Plan Total Prescribed Dose: 50.4 Gy
Reference Point Dosage Given to Date: 19.8 Gy
Reference Point Session Dosage Given: 1.8 Gy
Session Number: 11

## 2021-12-21 ENCOUNTER — Ambulatory Visit
Admission: RE | Admit: 2021-12-21 | Discharge: 2021-12-21 | Disposition: A | Discharge status: Home / Self Care | Payer: Medicare (Managed Care) | Source: Ambulatory Visit | Attending: Radiation Oncology | Admitting: Radiation Oncology

## 2021-12-21 ENCOUNTER — Inpatient Hospital Stay: Payer: Medicare (Managed Care)

## 2021-12-21 ENCOUNTER — Other Ambulatory Visit: Payer: Self-pay

## 2021-12-21 DIAGNOSIS — C210 Malignant neoplasm of anus, unspecified: Secondary | ICD-10-CM | POA: Diagnosis not present

## 2021-12-21 LAB — RAD ONC ARIA SESSION SUMMARY
Course Elapsed Days: 15
Plan Fractions Treated to Date: 12
Plan Prescribed Dose Per Fraction: 1.8 Gy
Plan Total Fractions Prescribed: 28
Plan Total Prescribed Dose: 50.4 Gy
Reference Point Dosage Given to Date: 21.6 Gy
Reference Point Session Dosage Given: 1.8 Gy
Session Number: 12

## 2021-12-22 ENCOUNTER — Other Ambulatory Visit: Payer: Self-pay

## 2021-12-22 ENCOUNTER — Inpatient Hospital Stay: Payer: Medicare (Managed Care)

## 2021-12-22 ENCOUNTER — Ambulatory Visit
Admission: RE | Admit: 2021-12-22 | Discharge: 2021-12-22 | Disposition: A | Discharge status: Home / Self Care | Payer: Medicare (Managed Care) | Source: Ambulatory Visit | Attending: Radiation Oncology | Admitting: Radiation Oncology

## 2021-12-22 DIAGNOSIS — C210 Malignant neoplasm of anus, unspecified: Secondary | ICD-10-CM | POA: Diagnosis not present

## 2021-12-22 LAB — RAD ONC ARIA SESSION SUMMARY
Course Elapsed Days: 16
Plan Fractions Treated to Date: 13
Plan Prescribed Dose Per Fraction: 1.8 Gy
Plan Total Fractions Prescribed: 28
Plan Total Prescribed Dose: 50.4 Gy
Reference Point Dosage Given to Date: 23.4 Gy
Reference Point Session Dosage Given: 1.8 Gy
Session Number: 13

## 2021-12-25 ENCOUNTER — Other Ambulatory Visit: Payer: Self-pay

## 2021-12-25 ENCOUNTER — Inpatient Hospital Stay: Payer: Medicare (Managed Care)

## 2021-12-25 ENCOUNTER — Ambulatory Visit
Admission: RE | Admit: 2021-12-25 | Discharge: 2021-12-25 | Disposition: A | Discharge status: Home / Self Care | Payer: Medicare (Managed Care) | Source: Ambulatory Visit | Attending: Radiation Oncology | Admitting: Radiation Oncology

## 2021-12-25 DIAGNOSIS — C210 Malignant neoplasm of anus, unspecified: Secondary | ICD-10-CM | POA: Diagnosis not present

## 2021-12-25 LAB — RAD ONC ARIA SESSION SUMMARY
Course Elapsed Days: 19
Plan Fractions Treated to Date: 14
Plan Prescribed Dose Per Fraction: 1.8 Gy
Plan Total Fractions Prescribed: 28
Plan Total Prescribed Dose: 50.4 Gy
Reference Point Dosage Given to Date: 25.2 Gy
Reference Point Session Dosage Given: 1.8 Gy
Session Number: 14

## 2021-12-26 ENCOUNTER — Ambulatory Visit
Admission: RE | Admit: 2021-12-26 | Discharge: 2021-12-26 | Disposition: A | Discharge status: Home / Self Care | Payer: Medicare (Managed Care) | Source: Ambulatory Visit | Attending: Radiation Oncology | Admitting: Radiation Oncology

## 2021-12-26 ENCOUNTER — Encounter: Payer: Self-pay | Admitting: Oncology

## 2021-12-26 ENCOUNTER — Other Ambulatory Visit: Payer: Self-pay

## 2021-12-26 ENCOUNTER — Other Ambulatory Visit (HOSPITAL_COMMUNITY): Payer: Self-pay

## 2021-12-26 ENCOUNTER — Other Ambulatory Visit: Payer: Self-pay | Admitting: Radiation Oncology

## 2021-12-26 ENCOUNTER — Telehealth: Payer: Self-pay

## 2021-12-26 ENCOUNTER — Ambulatory Visit: Payer: Medicare (Managed Care)

## 2021-12-26 DIAGNOSIS — C210 Malignant neoplasm of anus, unspecified: Secondary | ICD-10-CM

## 2021-12-26 LAB — RAD ONC ARIA SESSION SUMMARY
Course Elapsed Days: 20
Plan Fractions Treated to Date: 15
Plan Prescribed Dose Per Fraction: 1.8 Gy
Plan Total Fractions Prescribed: 28
Plan Total Prescribed Dose: 50.4 Gy
Reference Point Dosage Given to Date: 27 Gy
Reference Point Session Dosage Given: 1.8 Gy
Session Number: 15

## 2021-12-26 MED ORDER — NYSTATIN 100000 UNIT/ML MT SUSP
Freq: Two times a day (BID) | OROMUCOSAL | 0 refills | Status: DC
  Filled 2021-12-26: qty 300, 8d supply, fill #0

## 2021-12-26 MED ORDER — OXYCODONE-ACETAMINOPHEN 5-325 MG PO TABS
1.0000 | ORAL_TABLET | Freq: Four times a day (QID) | ORAL | 0 refills | Status: DC | PRN
  Filled 2021-12-26: qty 30, 8d supply, fill #0

## 2021-12-26 MED ORDER — SONAFINE EX EMUL
1.0000 | Freq: Once | CUTANEOUS | Status: AC
  Administered 2021-12-26: 1 via TOPICAL

## 2021-12-26 MED ORDER — SILVER SULFADIAZINE 1 % EX CREA: TOPICAL_CREAM | Freq: Every day | CUTANEOUS | Status: DC

## 2021-12-26 NOTE — Telephone Encounter (Signed)
Called in prescription for Dukes Magic mouthwash  to [facility] outpatient pharmacy per Dr. Gery Pray.

## 2021-12-27 ENCOUNTER — Other Ambulatory Visit: Payer: Self-pay

## 2021-12-27 ENCOUNTER — Ambulatory Visit
Admission: RE | Admit: 2021-12-27 | Discharge: 2021-12-27 | Disposition: A | Discharge status: Home / Self Care | Payer: Medicare (Managed Care) | Source: Ambulatory Visit | Attending: Radiation Oncology | Admitting: Radiation Oncology

## 2021-12-27 ENCOUNTER — Inpatient Hospital Stay: Payer: Medicare (Managed Care)

## 2021-12-27 DIAGNOSIS — C210 Malignant neoplasm of anus, unspecified: Secondary | ICD-10-CM | POA: Diagnosis not present

## 2021-12-27 LAB — RAD ONC ARIA SESSION SUMMARY
Course Elapsed Days: 21
Plan Fractions Treated to Date: 16
Plan Prescribed Dose Per Fraction: 1.8 Gy
Plan Total Fractions Prescribed: 28
Plan Total Prescribed Dose: 50.4 Gy
Reference Point Dosage Given to Date: 28.8 Gy
Reference Point Session Dosage Given: 1.8 Gy
Session Number: 16

## 2021-12-28 ENCOUNTER — Other Ambulatory Visit: Payer: Self-pay

## 2021-12-28 ENCOUNTER — Ambulatory Visit
Admission: RE | Admit: 2021-12-28 | Discharge: 2021-12-28 | Disposition: A | Discharge status: Home / Self Care | Payer: Medicare (Managed Care) | Source: Ambulatory Visit | Attending: Radiation Oncology | Admitting: Radiation Oncology

## 2021-12-28 ENCOUNTER — Inpatient Hospital Stay: Payer: Medicare (Managed Care)

## 2021-12-28 ENCOUNTER — Encounter (HOSPITAL_COMMUNITY): Payer: Self-pay

## 2021-12-28 ENCOUNTER — Ambulatory Visit (HOSPITAL_COMMUNITY)
Admission: RE | Admit: 2021-12-28 | Discharge: 2021-12-28 | Disposition: A | Discharge status: Home / Self Care | Payer: Medicare (Managed Care) | Source: Ambulatory Visit | Attending: Nurse Practitioner | Admitting: Nurse Practitioner

## 2021-12-28 DIAGNOSIS — C210 Malignant neoplasm of anus, unspecified: Secondary | ICD-10-CM | POA: Diagnosis not present

## 2021-12-28 LAB — RAD ONC ARIA SESSION SUMMARY
Course Elapsed Days: 22
Plan Fractions Treated to Date: 17
Plan Prescribed Dose Per Fraction: 1.8 Gy
Plan Total Fractions Prescribed: 28
Plan Total Prescribed Dose: 50.4 Gy
Reference Point Dosage Given to Date: 30.6 Gy
Reference Point Session Dosage Given: 1.8 Gy
Session Number: 17

## 2021-12-28 NOTE — Progress Notes (Addendum)
Patient ID: Lisa Nolan, female   DOB: [birth year], 66 y.o.   MRN: [id number] Pt presented to IR dept today for PICC line placement. She has hx anal cancer. Pt did express concern this morning about going through with chemotherapy and getting PICC. Dr. Earleen Newport met with pt and discussed venous access alternatives . At this time she wants to hold on PICC placement. She will discuss matter further with Dr. Benay Spice. Marlynn Perking notified of above.

## 2021-12-29 ENCOUNTER — Other Ambulatory Visit: Payer: Self-pay

## 2021-12-29 ENCOUNTER — Ambulatory Visit
Admission: RE | Admit: 2021-12-29 | Discharge: 2021-12-29 | Disposition: A | Discharge status: Home / Self Care | Payer: Medicare (Managed Care) | Source: Ambulatory Visit | Attending: Radiation Oncology | Admitting: Radiation Oncology

## 2021-12-29 DIAGNOSIS — C210 Malignant neoplasm of anus, unspecified: Secondary | ICD-10-CM | POA: Diagnosis not present

## 2021-12-29 LAB — RAD ONC ARIA SESSION SUMMARY
Course Elapsed Days: 23
Plan Fractions Treated to Date: 18
Plan Prescribed Dose Per Fraction: 1.8 Gy
Plan Total Fractions Prescribed: 28
Plan Total Prescribed Dose: 50.4 Gy
Reference Point Dosage Given to Date: 32.4 Gy
Reference Point Session Dosage Given: 1.8 Gy
Session Number: 18

## 2022-01-01 ENCOUNTER — Other Ambulatory Visit: Payer: Self-pay

## 2022-01-01 ENCOUNTER — Inpatient Hospital Stay: Payer: Medicare (Managed Care) | Attending: Oncology | Admitting: Oncology

## 2022-01-01 ENCOUNTER — Inpatient Hospital Stay: Payer: Medicare (Managed Care)

## 2022-01-01 ENCOUNTER — Ambulatory Visit
Admission: RE | Admit: 2022-01-01 | Discharge: 2022-01-01 | Disposition: A | Discharge status: Home / Self Care | Payer: Medicare (Managed Care) | Source: Ambulatory Visit | Attending: Radiation Oncology | Admitting: Radiation Oncology

## 2022-01-01 ENCOUNTER — Telehealth: Payer: Self-pay | Admitting: *Deleted

## 2022-01-01 DIAGNOSIS — Z5111 Encounter for antineoplastic chemotherapy: Secondary | ICD-10-CM | POA: Insufficient documentation

## 2022-01-01 DIAGNOSIS — C210 Malignant neoplasm of anus, unspecified: Secondary | ICD-10-CM | POA: Diagnosis present

## 2022-01-01 LAB — RAD ONC ARIA SESSION SUMMARY
Course Elapsed Days: 26
Plan Fractions Treated to Date: 19
Plan Prescribed Dose Per Fraction: 1.8 Gy
Plan Total Fractions Prescribed: 28
Plan Total Prescribed Dose: 50.4 Gy
Reference Point Dosage Given to Date: 34.2 Gy
Reference Point Session Dosage Given: 1.8 Gy
Session Number: 19

## 2022-01-01 MED ORDER — SILVER SULFADIAZINE 1 % EX CREA: TOPICAL_CREAM | Freq: Once | CUTANEOUS | Status: AC

## 2022-01-01 NOTE — Telephone Encounter (Signed)
Called patient to f/u on "no show" today. She reports she had forgotten she had an appointment today. Agrees to reschedule.

## 2022-01-02 ENCOUNTER — Other Ambulatory Visit: Payer: Self-pay

## 2022-01-02 ENCOUNTER — Ambulatory Visit
Admission: RE | Admit: 2022-01-02 | Discharge: 2022-01-02 | Disposition: A | Discharge status: Home / Self Care | Payer: Medicare (Managed Care) | Source: Ambulatory Visit | Attending: Radiation Oncology | Admitting: Radiation Oncology

## 2022-01-02 ENCOUNTER — Ambulatory Visit: Payer: Medicare (Managed Care)

## 2022-01-02 DIAGNOSIS — C210 Malignant neoplasm of anus, unspecified: Secondary | ICD-10-CM | POA: Diagnosis not present

## 2022-01-02 LAB — RAD ONC ARIA SESSION SUMMARY
Course Elapsed Days: 27
Plan Fractions Treated to Date: 20
Plan Prescribed Dose Per Fraction: 1.8 Gy
Plan Total Fractions Prescribed: 28
Plan Total Prescribed Dose: 50.4 Gy
Reference Point Dosage Given to Date: 36 Gy
Reference Point Session Dosage Given: 1.8 Gy
Session Number: 20

## 2022-01-02 MED ORDER — SILVER SULFADIAZINE 1 % EX CREA: TOPICAL_CREAM | Freq: Every day | CUTANEOUS | Status: DC

## 2022-01-03 ENCOUNTER — Other Ambulatory Visit: Payer: Self-pay

## 2022-01-03 ENCOUNTER — Ambulatory Visit
Admission: RE | Admit: 2022-01-03 | Discharge: 2022-01-03 | Disposition: A | Discharge status: Home / Self Care | Payer: Medicare (Managed Care) | Source: Ambulatory Visit | Attending: Radiation Oncology | Admitting: Radiation Oncology

## 2022-01-03 ENCOUNTER — Other Ambulatory Visit (HOSPITAL_COMMUNITY): Payer: Self-pay

## 2022-01-03 DIAGNOSIS — C210 Malignant neoplasm of anus, unspecified: Secondary | ICD-10-CM | POA: Diagnosis not present

## 2022-01-03 LAB — RAD ONC ARIA SESSION SUMMARY
Course Elapsed Days: 28
Plan Fractions Treated to Date: 21
Plan Prescribed Dose Per Fraction: 1.8 Gy
Plan Total Fractions Prescribed: 28
Plan Total Prescribed Dose: 50.4 Gy
Reference Point Dosage Given to Date: 37.8 Gy
Reference Point Session Dosage Given: 1.8 Gy
Session Number: 21

## 2022-01-04 ENCOUNTER — Encounter: Payer: Self-pay | Admitting: Oncology

## 2022-01-04 ENCOUNTER — Inpatient Hospital Stay (HOSPITAL_BASED_OUTPATIENT_CLINIC_OR_DEPARTMENT_OTHER): Payer: Medicare (Managed Care) | Admitting: Oncology

## 2022-01-04 ENCOUNTER — Ambulatory Visit
Admission: RE | Admit: 2022-01-04 | Discharge: 2022-01-04 | Disposition: A | Discharge status: Home / Self Care | Payer: Medicare (Managed Care) | Source: Ambulatory Visit | Attending: Radiation Oncology | Admitting: Radiation Oncology

## 2022-01-04 ENCOUNTER — Other Ambulatory Visit: Payer: Self-pay

## 2022-01-04 VITALS — BP 118/67 | HR 84 | Temp 98.1°F | Resp 18 | Ht 62.0 in | Wt 131.0 lb

## 2022-01-04 DIAGNOSIS — C210 Malignant neoplasm of anus, unspecified: Secondary | ICD-10-CM

## 2022-01-04 DIAGNOSIS — Z5111 Encounter for antineoplastic chemotherapy: Secondary | ICD-10-CM | POA: Diagnosis present

## 2022-01-04 LAB — RAD ONC ARIA SESSION SUMMARY
Course Elapsed Days: 29
Plan Fractions Treated to Date: 22
Plan Prescribed Dose Per Fraction: 1.8 Gy
Plan Total Fractions Prescribed: 28
Plan Total Prescribed Dose: 50.4 Gy
Reference Point Dosage Given to Date: 39.6 Gy
Reference Point Session Dosage Given: 1.8 Gy
Session Number: 22

## 2022-01-04 NOTE — Progress Notes (Signed)
  [facility] OFFICE PROGRESS NOTE   Diagnosis: Anal cancer  INTERVAL HISTORY:   Lisa Nolan completed cycle one 5-FU/Mitomycin-C beginning 12/05/2021.  No nausea/vomiting, mouth sores, or hand/foot pain following chemotherapy.  She had 1 episode of diarrhea.  She continues radiation.  She has developed erythema and skin breakdown at the perineum.  She is using a barrier cream.  She takes oxycodone for pain when needed. She was scheduled for PICC placement 12/29/2021 and cycle 2 chemotherapy on 01/01/2022.  She decided against PICC placement due to fear of potential toxicity with the second cycle of chemotherapy.  She has reconsidered and plans to proceed with cycle 2.  Objective:  Vital signs in last 24 hours:  Blood pressure 118/67, pulse 84, temperature 98.1 F (36.7 C), temperature source Oral, resp. rate 18, height '5\' 2"'$  (1.575 m), weight 131 lb (59.4 kg), SpO2 98 %.    HEENT: No thrush or ulcers Resp: Lungs clear bilaterally Cardio: Regular rate and rhythm GI: No hepatosplenomegaly, no mass, nontender Vascular: No leg edema  Skin: Erythema at the labia and perineum, superficial skin breakdown at the posterior aspect of the perineum, skin tags at the anal verge   Lab Results:  Lab Results  Component Value Date   WBC 4.5 12/15/2021   HGB 13.8 12/15/2021   HCT 40.8 12/15/2021   MCV 93.6 12/15/2021   PLT 176 12/15/2021   NEUTROABS 3.4 12/15/2021    CMP  Lab Results  Component Value Date   NA 142 12/15/2021   K 4.4 12/15/2021   CL 104 12/15/2021   CO2 29 12/15/2021   GLUCOSE 110 (H) 12/15/2021   BUN 18 12/15/2021   CREATININE 0.69 12/15/2021   CALCIUM 9.8 12/15/2021   PROT 7.3 12/15/2021   ALBUMIN 4.5 12/15/2021   AST 25 12/15/2021   ALT 19 12/15/2021   ALKPHOS 63 12/15/2021   BILITOT 0.5 12/15/2021   GFRNONAA >60 12/15/2021    Medications: I have reviewed the patient's current medications.   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  No hypermetabolic local regional lymphadenopathy or distant metastatic disease.  Tiny 0.3 cm solid basilar right lower lobe pulmonary nodule below PET resolution. Cycle one 5-FU/Mitomycin-C beginning 12/05/2021 Radiation beginning 12/06/2021 Tobacco use Small right lower lobe pulmonary nodule on PET scan 11/20/2021-noncontrast chest CT in 3 months    Disposition: [name] has a history of anal cancer.  She is completing a course of concurrent 5-FU/Mitomycin-C and radiation.  She tolerated the first cycle of chemotherapy well.  She was scheduled to complete cycle 2 beginning 01/01/2022.  She agrees to proceed with cycle 2 chemotherapy concurrent with the last week of radiation.  She will be referred for PICC placement prior to beginning chemotherapy.  She will complete the final cycle of chemotherapy during the week of 01/08/2022.  She will return for an office visit and nadir CBC on 01/22/2022.  She will call for a fever or bleeding.  She will continue to use barrier cream for the perineal skin breakdown.  She will use oxycodone as needed for pain.  Betsy Coder, MD  01/04/2022  3:47 PM

## 2022-01-05 ENCOUNTER — Inpatient Hospital Stay: Payer: Medicare (Managed Care)

## 2022-01-05 ENCOUNTER — Ambulatory Visit
Admission: RE | Admit: 2022-01-05 | Discharge: 2022-01-05 | Disposition: A | Discharge status: Home / Self Care | Payer: Medicare (Managed Care) | Source: Ambulatory Visit | Attending: Radiation Oncology | Admitting: Radiation Oncology

## 2022-01-05 ENCOUNTER — Other Ambulatory Visit: Payer: Self-pay | Admitting: Nurse Practitioner

## 2022-01-05 ENCOUNTER — Other Ambulatory Visit: Payer: Self-pay

## 2022-01-05 ENCOUNTER — Ambulatory Visit (HOSPITAL_COMMUNITY): Payer: Medicare (Managed Care)

## 2022-01-05 ENCOUNTER — Telehealth: Payer: Self-pay | Admitting: *Deleted

## 2022-01-05 DIAGNOSIS — C210 Malignant neoplasm of anus, unspecified: Secondary | ICD-10-CM | POA: Diagnosis not present

## 2022-01-05 LAB — RAD ONC ARIA SESSION SUMMARY
Course Elapsed Days: 30
Plan Fractions Treated to Date: 23
Plan Prescribed Dose Per Fraction: 1.8 Gy
Plan Total Fractions Prescribed: 28
Plan Total Prescribed Dose: 50.4 Gy
Reference Point Dosage Given to Date: 41.4 Gy
Reference Point Session Dosage Given: 1.8 Gy
Session Number: 23

## 2022-01-05 NOTE — Telephone Encounter (Addendum)
Able to move PICC placement to [facility] on 10/10 at 0800. Can then go to radiation oncology afterwards and to [facility] for chemo start w/pump d/c Saturday at [facility].  Left patient VM with this plan and requested return call to confirm this will work or her or does she want to move it out to following Monday. Patient returned call and agrees to PICC on 1010 at 0800 followed by RT, then lab/chemo and pump start here on 10/10. Pump d/c will be on 10/14 (Saturday) and per Dr. Benay Spice, [place] to stop pump early to accommodate clinic hours (no bolus).

## 2022-01-07 ENCOUNTER — Other Ambulatory Visit: Payer: Self-pay

## 2022-01-08 ENCOUNTER — Other Ambulatory Visit: Payer: Self-pay

## 2022-01-08 ENCOUNTER — Ambulatory Visit
Admission: RE | Admit: 2022-01-08 | Discharge: 2022-01-08 | Disposition: A | Discharge status: Home / Self Care | Payer: Medicare (Managed Care) | Source: Ambulatory Visit | Attending: Radiation Oncology | Admitting: Radiation Oncology

## 2022-01-08 DIAGNOSIS — C210 Malignant neoplasm of anus, unspecified: Secondary | ICD-10-CM | POA: Diagnosis not present

## 2022-01-08 LAB — RAD ONC ARIA SESSION SUMMARY
Course Elapsed Days: 33
Plan Fractions Treated to Date: 24
Plan Prescribed Dose Per Fraction: 1.8 Gy
Plan Total Fractions Prescribed: 28
Plan Total Prescribed Dose: 50.4 Gy
Reference Point Dosage Given to Date: 43.2 Gy
Reference Point Session Dosage Given: 1.8 Gy
Session Number: 24

## 2022-01-09 ENCOUNTER — Inpatient Hospital Stay: Payer: Medicare (Managed Care)

## 2022-01-09 ENCOUNTER — Inpatient Hospital Stay: Payer: Medicare (Managed Care) | Admitting: Licensed Clinical Social Worker

## 2022-01-09 ENCOUNTER — Ambulatory Visit
Admission: RE | Admit: 2022-01-09 | Discharge: 2022-01-09 | Disposition: A | Discharge status: Home / Self Care | Payer: Medicare (Managed Care) | Source: Ambulatory Visit | Attending: Radiation Oncology | Admitting: Radiation Oncology

## 2022-01-09 ENCOUNTER — Other Ambulatory Visit: Payer: Self-pay

## 2022-01-09 ENCOUNTER — Other Ambulatory Visit: Payer: Self-pay | Admitting: *Deleted

## 2022-01-09 ENCOUNTER — Ambulatory Visit (HOSPITAL_COMMUNITY)
Admission: RE | Admit: 2022-01-09 | Discharge: 2022-01-09 | Disposition: A | Discharge status: Home / Self Care | Payer: Medicare (Managed Care) | Source: Ambulatory Visit | Attending: Oncology | Admitting: Oncology

## 2022-01-09 VITALS — BP 109/60 | HR 66 | Temp 97.8°F | Resp 18 | Ht 62.0 in | Wt 130.0 lb

## 2022-01-09 DIAGNOSIS — Z5111 Encounter for antineoplastic chemotherapy: Secondary | ICD-10-CM | POA: Diagnosis not present

## 2022-01-09 DIAGNOSIS — C210 Malignant neoplasm of anus, unspecified: Secondary | ICD-10-CM | POA: Diagnosis present

## 2022-01-09 HISTORY — PX: IR FLUORO GUIDE CV LINE LEFT: IMG2282

## 2022-01-09 LAB — CBC WITH DIFFERENTIAL (CANCER CENTER ONLY)
Abs Immature Granulocytes: 0.01 10*3/uL (ref 0.00–0.07)
Basophils Absolute: 0 10*3/uL (ref 0.0–0.1)
Basophils Relative: 0 %
Eosinophils Absolute: 0.2 10*3/uL (ref 0.0–0.5)
Eosinophils Relative: 3 %
HCT: 37.2 % (ref 36.0–46.0)
Hemoglobin: 12.5 g/dL (ref 12.0–15.0)
Immature Granulocytes: 0 %
Lymphocytes Relative: 8 %
Lymphs Abs: 0.4 10*3/uL — ABNORMAL LOW (ref 0.7–4.0)
MCH: 31.8 pg (ref 26.0–34.0)
MCHC: 33.6 g/dL (ref 30.0–36.0)
MCV: 94.7 fL (ref 80.0–100.0)
Monocytes Absolute: 0.3 10*3/uL (ref 0.1–1.0)
Monocytes Relative: 6 %
Neutro Abs: 4.6 10*3/uL (ref 1.7–7.7)
Neutrophils Relative %: 83 %
Platelet Count: 182 10*3/uL (ref 150–400)
RBC: 3.93 MIL/uL (ref 3.87–5.11)
RDW: 13.5 % (ref 11.5–15.5)
WBC Count: 5.6 10*3/uL (ref 4.0–10.5)
nRBC: 0 % (ref 0.0–0.2)

## 2022-01-09 LAB — CMP (CANCER CENTER ONLY)
ALT: 21 U/L (ref 0–44)
AST: 25 U/L (ref 15–41)
Albumin: 4.2 g/dL (ref 3.5–5.0)
Alkaline Phosphatase: 58 U/L (ref 38–126)
Anion gap: 7 (ref 5–15)
BUN: 15 mg/dL (ref 8–23)
CO2: 27 mmol/L (ref 22–32)
Calcium: 9.5 mg/dL (ref 8.9–10.3)
Chloride: 105 mmol/L (ref 98–111)
Creatinine: 0.71 mg/dL (ref 0.44–1.00)
GFR, Estimated: >60 mL/min (ref >60)
Glucose, Bld: 92 mg/dL (ref 70–99)
Potassium: 3.8 mmol/L (ref 3.5–5.1)
Sodium: 139 mmol/L (ref 135–145)
Total Bilirubin: 0.5 mg/dL (ref 0.3–1.2)
Total Protein: 7.5 g/dL (ref 6.5–8.1)

## 2022-01-09 LAB — RAD ONC ARIA SESSION SUMMARY
Course Elapsed Days: 34
Plan Fractions Treated to Date: 25
Plan Prescribed Dose Per Fraction: 1.8 Gy
Plan Total Fractions Prescribed: 28
Plan Total Prescribed Dose: 50.4 Gy
Reference Point Dosage Given to Date: 45 Gy
Reference Point Session Dosage Given: 1.8 Gy
Session Number: 25

## 2022-01-09 MED ORDER — LIDOCAINE HCL 1 % IJ SOLN
INTRAMUSCULAR | Status: AC
  Filled 2022-01-09: qty 20

## 2022-01-09 MED ORDER — HEPARIN SOD (PORK) LOCK FLUSH 100 UNIT/ML IV SOLN
INTRAVENOUS | Status: AC
  Filled 2022-01-09: qty 5

## 2022-01-09 MED ORDER — SILVER SULFADIAZINE 1 % EX CREA: TOPICAL_CREAM | Freq: Once | CUTANEOUS | Status: AC

## 2022-01-09 MED ORDER — PROCHLORPERAZINE MALEATE 10 MG PO TABS
10.0000 mg | ORAL_TABLET | Freq: Once | ORAL | Status: AC
  Administered 2022-01-09: 10 mg via ORAL
  Filled 2022-01-09: qty 1

## 2022-01-09 MED ORDER — SODIUM CHLORIDE 0.9 % IV SOLN
1000.0000 mg/m2/d | INTRAVENOUS | Status: DC
  Administered 2022-01-09: 6450 mg via INTRAVENOUS
  Filled 2022-01-09: qty 100

## 2022-01-09 MED ORDER — SODIUM CHLORIDE 0.9 % IV SOLN: Freq: Once | INTRAVENOUS | Status: AC

## 2022-01-09 MED ORDER — LIDOCAINE HCL (PF) 1 % IJ SOLN: 20.0000 mL | Freq: Once | INTRAMUSCULAR | Status: DC

## 2022-01-09 MED ORDER — SODIUM CHLORIDE 0.9% FLUSH: 10.0000 mL | INTRAVENOUS | Status: DC | PRN

## 2022-01-09 MED ORDER — MITOMYCIN CHEMO IV INJECTION 20 MG
10.0000 mg/m2 | Freq: Once | INTRAVENOUS | Status: AC
  Administered 2022-01-09: 16 mg via INTRAVENOUS
  Filled 2022-01-09: qty 32

## 2022-01-09 NOTE — Procedures (Signed)
Successful placement of dual lumen PICC line to right brachial vein. Length 36 cm Tip at lower SVC/RA PICC capped No complications Ready for use.  EBL < 5 mL   Tyson Alias, AGNP 01/09/2022 9:05 AM

## 2022-01-09 NOTE — Progress Notes (Signed)
[facility] CSW Progress Note  Holiday representative met with patient to assess needs and to provide support.  Lisa Nolan said she talks to her 67 y/o mother three times a day who lives in [place].  She does not want to disclose she has cancer to her mother because she is afraid of how worried she will be.    Lisa Nolan said she drives minimally due to anxiety and having a panic attack while driving.  She is interested in the cooking class at [employer] and was emailed the information.  CSW provided active listening while Lisa Nolan discussed her minimal support system.  Encouraged her to participate in either virtual support groups or activities available through the [employer] and [employer].    Lisa Pickle Cotey Rakes, Lisa Nolan

## 2022-01-09 NOTE — Patient Instructions (Addendum)
Lisa Nolan   Discharge Instructions: The chemotherapy medication bag should finish at 46 hours, 96 hours, or 7 days. For example, if your pump is scheduled for 46 hours and it was put on at 4:00 p.m., it should finish at 2:00 p.m. the day it is scheduled to come off regardless of your appointment time.     Estimated time to finish at 1:00pm Saturday, October 14th, 2023 at [facility].   If the display on your pump reads "Low Volume" and it is beeping, take the batteries out of the pump and come to the cancer center for it to be taken off.   If the pump alarms go off prior to the pump reading "Low Volume" then call [phone] and someone can assist you.  If the plunger comes out and the chemotherapy medication is leaking out, please use your home chemo spill kit to clean up the spill. Do NOT use paper towels or other household products.  If you have problems or questions regarding your pump, please call either [phone] (24 hours a day) or the cancer center Monday-Friday 8:00 a.m.- 4:30 p.m. at the clinic number and we will assist you. If you are unable to get assistance, then go to the nearest Emergency Department and ask the staff to contact the IV team for assistance. . Thank you for choosing [facility] to provide your oncology and hematology care.   If you have a lab appointment with the [facility], please go directly to the [facility] and check in at the registration area.   Wear comfortable clothing and clothing appropriate for easy access to any Portacath or PICC line.   We strive to give you quality time with your provider. You may need to reschedule your appointment if you arrive late (15 or more minutes).  Arriving late affects you and other patients whose appointments are after yours.  Also, if you miss three or more appointments without notifying the office, you may be dismissed from the clinic at the provider's discretion.       For prescription refill requests, have your pharmacy contact our office and allow 72 hours for refills to be completed.    Today you received the following chemotherapy and/or immunotherapy agents Mitomycin, Fluorouracil      To help prevent nausea and vomiting after your treatment, we encourage you to take your nausea medication as directed.  BELOW ARE SYMPTOMS THAT SHOULD BE REPORTED IMMEDIATELY: *FEVER GREATER THAN 100.4 F (38 C) OR HIGHER *CHILLS OR SWEATING *NAUSEA AND VOMITING THAT IS NOT CONTROLLED WITH YOUR NAUSEA MEDICATION *UNUSUAL SHORTNESS OF BREATH *UNUSUAL BRUISING OR BLEEDING *URINARY PROBLEMS (pain or burning when urinating, or frequent urination) *BOWEL PROBLEMS (unusual diarrhea, constipation, pain near the anus) TENDERNESS IN MOUTH AND THROAT WITH OR WITHOUT PRESENCE OF ULCERS (sore throat, sores in mouth, or a toothache) UNUSUAL RASH, SWELLING OR PAIN  UNUSUAL VAGINAL DISCHARGE OR ITCHING   Items with * indicate a potential emergency and should be followed up as soon as possible or go to the Emergency Department if any problems should occur.  Please show the CHEMOTHERAPY ALERT CARD or IMMUNOTHERAPY ALERT CARD at check-in to the Emergency Department and triage nurse.  Should you have questions after your visit or need to cancel or reschedule your appointment, please contact [facility]  Dept: [phone]  and follow the prompts.  Office hours are 8:00 a.m. to 4:30 p.m. Monday - Friday. Please note  that voicemails left after 4:00 p.m. may not be returned until the following business day.  We are closed weekends and major holidays. You have access to a nurse at all times for urgent questions. Please call the main number to the clinic Dept: [phone] and follow the prompts.   For any non-urgent questions, you may also contact your provider using MyChart. We now offer e-Visits for anyone 104 and older to request care online for non-urgent  symptoms. For details visit [email or web address].   Also download the MyChart app! Go to the app store, search "MyChart", open the app, select [facility], and log in with your MyChart username and password.  Masks are optional in the cancer centers. If you would like for your care team to wear a mask while they are taking care of you, please let them know. You may have one support person who is at least 66 years old accompany you for your appointments.  Mitomycin Injection What is this medication? MITOMYCIN (mye toe MYE sin) treats stomach cancer and pancreatic cancer. It works by slowing down the growth of cancer cells. This medicine may be used for other purposes; ask your health care provider or pharmacist if you have questions. COMMON BRAND NAME(S): Mutamycin What should I tell my care team before I take this medication? They need to know if you have any of these conditions: Bleeding disorders Infection, such as chickenpox, cold sores, herpes Low blood counts, such as low white cells, platelets, red blood cells Kidney disease An unusual or allergic reaction to mitomycin, other medications, foods, dyes, or preservatives Pregnant or trying to get pregnant Breastfeeding How should I use this medication? This medication is injected into a vein. It is given by your care team in a hospital or clinic setting. Talk to your care team about the use of this medication in children. Special care may be needed. Overdosage: If you think you have taken too much of this medicine contact a poison control center or emergency room at once. NOTE: This medicine is only for you. Do not share this medicine with others. What if I miss a dose? Keep appointments for follow-up doses. It is important not to miss your dose. Call your care team if you are unable to keep an appointment. What may interact with this medication? Interactions are not expected. This list may not describe all possible interactions.  Give your health care provider a list of all the medicines, herbs, non-prescription drugs, or dietary supplements you use. Also tell them if you smoke, drink alcohol, or use illegal drugs. Some items may interact with your medicine. What should I watch for while using this medication? Your condition will be monitored carefully while you are receiving this medication. You may need blood work while taking this medication. This medication may make you feel generally unwell. This is not uncommon as chemotherapy can affect healthy cells as well as cancer cells. Report any side effects. Continue your course of treatment even though you feel ill unless your care team tells you to stop. This medication may increase your risk of getting an infection. Call your care team for advice if you get a fever, chills, sore throat, or other symptoms of a cold or flu. Do not treat yourself. Try to avoid being around people who are sick. Avoid taking medications that contain aspirin, acetaminophen, ibuprofen, naproxen, or ketoprofen unless instructed by your care team. These medications may hide a fever. This medication may increase your risk to bruise or bleed.  Call your care team if you notice any unusual bleeding. Be careful brushing or flossing your teeth or using a toothpick because you may get an infection or bleed more easily. If you have any dental work done, tell your dentist you are receiving this medication. Talk to your care team if you may be pregnant. Serious birth defects can occur if you take this medication during pregnancy. Contraception is recommended while taking this medication. Your care team can help you find the option that works for you. Do not breastfeed while taking this medication. What side effects may I notice from receiving this medication? Side effects that you should report to your care team as soon as possible: Allergic reactions--skin rash, itching, hives, swelling of the face, lips, tongue,  or throat Dry cough, shortness of breath or trouble breathing Infection--fever, chills, cough, sore throat, wounds that don't heal, pain or trouble when passing urine, general feeling of discomfort or being unwell Kidney injury--decrease in the amount of urine, swelling of the ankles, hands, or feet Low red blood cell level--unusual weakness or fatigue, dizziness, headache, trouble breathing Stomach pain, bloody diarrhea, pale skin, unusual weakness or fatigue, decrease in the amount of urine, which may be signs of hemolytic uremic syndrome Unusual bruising or bleeding Side effects that usually do not require medical attention (report these to your care team if they continue or are bothersome): Diarrhea Hair loss Loss of appetite with weight loss Nausea Pain, redness, or swelling with sores inside the mouth or throat This list may not describe all possible side effects. Call your doctor for medical advice about side effects. You may report side effects to FDA at 1-800-FDA-1088. Where should I keep my medication? This medication is given in a hospital or clinic. It will not be stored at home. NOTE: This sheet is a summary. It may not cover all possible information. If you have questions about this medicine, talk to your doctor, pharmacist, or health care provider.  2023 Elsevier/Gold Standard (2021-08-09 00:00:00)  Fluorouracil Injection What is this medication? FLUOROURACIL (flure oh YOOR a sil) treats some types of cancer. It works by slowing down the growth of cancer cells. This medicine may be used for other purposes; ask your health care provider or pharmacist if you have questions. COMMON BRAND NAME(S): Adrucil What should I tell my care team before I take this medication? They need to know if you have any of these conditions: Blood disorders Dihydropyrimidine dehydrogenase (DPD) deficiency Infection, such as chickenpox, cold sores, herpes Kidney disease Liver disease Poor  nutrition Recent or ongoing radiation therapy An unusual or allergic reaction to fluorouracil, other medications, foods, dyes, or preservatives If you or your partner are pregnant or trying to get pregnant Breast-feeding How should I use this medication? This medication is injected into a vein. It is administered by your care team in a hospital or clinic setting. Talk to your care team about the use of this medication in children. Special care may be needed. Overdosage: If you think you have taken too much of this medicine contact a poison control center or emergency room at once. NOTE: This medicine is only for you. Do not share this medicine with others. What if I miss a dose? Keep appointments for follow-up doses. It is important not to miss your dose. Call your care team if you are unable to keep an appointment. What may interact with this medication? Do not take this medication with any of the following: Live virus vaccines This medication  may also interact with the following: Medications that treat or prevent blood clots, such as warfarin, enoxaparin, dalteparin This list may not describe all possible interactions. Give your health care provider a list of all the medicines, herbs, non-prescription drugs, or dietary supplements you use. Also tell them if you smoke, drink alcohol, or use illegal drugs. Some items may interact with your medicine. What should I watch for while using this medication? Your condition will be monitored carefully while you are receiving this medication. This medication may make you feel generally unwell. This is not uncommon as chemotherapy can affect healthy cells as well as cancer cells. Report any side effects. Continue your course of treatment even though you feel ill unless your care team tells you to stop. In some cases, you may be given additional medications to help with side effects. Follow all directions for their use. This medication may increase your  risk of getting an infection. Call your care team for advice if you get a fever, chills, sore throat, or other symptoms of a cold or flu. Do not treat yourself. Try to avoid being around people who are sick. This medication may increase your risk to bruise or bleed. Call your care team if you notice any unusual bleeding. Be careful brushing or flossing your teeth or using a toothpick because you may get an infection or bleed more easily. If you have any dental work done, tell your dentist you are receiving this medication. Avoid taking medications that contain aspirin, acetaminophen, ibuprofen, naproxen, or ketoprofen unless instructed by your care team. These medications may hide a fever. Do not treat diarrhea with over the counter products. Contact your care team if you have diarrhea that lasts more than 2 days or if it is severe and watery. This medication can make you more sensitive to the sun. Keep out of the sun. If you cannot avoid being in the sun, wear protective clothing and sunscreen. Do not use sun lamps, tanning beds, or tanning booths. Talk to your care team if you or your partner wish to become pregnant or think you might be pregnant. This medication can cause serious birth defects if taken during pregnancy and for 3 months after the last dose. A reliable form of contraception is recommended while taking this medication and for 3 months after the last dose. Talk to your care team about effective forms of contraception. Do not father a child while taking this medication and for 3 months after the last dose. Use a condom while having sex during this time period. Do not breastfeed while taking this medication. This medication may cause infertility. Talk to your care team if you are concerned about your fertility. What side effects may I notice from receiving this medication? Side effects that you should report to your care team as soon as possible: Allergic reactions--skin rash, itching,  hives, swelling of the face, lips, tongue, or throat Heart attack--pain or tightness in the chest, shoulders, arms, or jaw, nausea, shortness of breath, cold or clammy skin, feeling faint or lightheaded Heart failure--shortness of breath, swelling of the ankles, feet, or hands, sudden weight gain, unusual weakness or fatigue Heart rhythm changes--fast or irregular heartbeat, dizziness, feeling faint or lightheaded, chest pain, trouble breathing High ammonia level--unusual weakness or fatigue, confusion, loss of appetite, nausea, vomiting, seizures Infection--fever, chills, cough, sore throat, wounds that don't heal, pain or trouble when passing urine, general feeling of discomfort or being unwell Low red blood cell level--unusual weakness or fatigue, dizziness,  headache, trouble breathing Pain, tingling, or numbness in the hands or feet, muscle weakness, change in vision, confusion or trouble speaking, loss of balance or coordination, trouble walking, seizures Redness, swelling, and blistering of the skin over hands and feet Severe or prolonged diarrhea Unusual bruising or bleeding Side effects that usually do not require medical attention (report to your care team if they continue or are bothersome): Dry skin Headache Increased tears Nausea Pain, redness, or swelling with sores inside the mouth or throat Sensitivity to light Vomiting This list may not describe all possible side effects. Call your doctor for medical advice about side effects. You may report side effects to FDA at 1-800-FDA-1088. Where should I keep my medication? This medication is given in a hospital or clinic. It will not be stored at home. NOTE: This sheet is a summary. It may not cover all possible information. If you have questions about this medicine, talk to your doctor, pharmacist, or health care provider.  2023 Elsevier/Gold Standard (2021-07-25 00:00:00)

## 2022-01-10 ENCOUNTER — Other Ambulatory Visit: Payer: Self-pay

## 2022-01-10 ENCOUNTER — Inpatient Hospital Stay: Payer: Medicare (Managed Care)

## 2022-01-10 ENCOUNTER — Other Ambulatory Visit: Payer: Self-pay | Admitting: Oncology

## 2022-01-10 ENCOUNTER — Ambulatory Visit (HOSPITAL_COMMUNITY): Payer: Medicare (Managed Care)

## 2022-01-10 ENCOUNTER — Ambulatory Visit
Admission: RE | Admit: 2022-01-10 | Discharge: 2022-01-10 | Disposition: A | Discharge status: Home / Self Care | Payer: Medicare (Managed Care) | Source: Ambulatory Visit | Attending: Radiation Oncology | Admitting: Radiation Oncology

## 2022-01-10 ENCOUNTER — Encounter (HOSPITAL_COMMUNITY): Payer: Self-pay | Admitting: Radiology

## 2022-01-10 DIAGNOSIS — C210 Malignant neoplasm of anus, unspecified: Secondary | ICD-10-CM

## 2022-01-10 LAB — RAD ONC ARIA SESSION SUMMARY
Course Elapsed Days: 35
Plan Fractions Treated to Date: 26
Plan Prescribed Dose Per Fraction: 1.8 Gy
Plan Total Fractions Prescribed: 28
Plan Total Prescribed Dose: 50.4 Gy
Reference Point Dosage Given to Date: 46.8 Gy
Reference Point Session Dosage Given: 1.8 Gy
Session Number: 26

## 2022-01-11 ENCOUNTER — Ambulatory Visit
Admission: RE | Admit: 2022-01-11 | Discharge: 2022-01-11 | Disposition: A | Discharge status: Home / Self Care | Payer: Medicare (Managed Care) | Source: Ambulatory Visit | Attending: Radiation Oncology | Admitting: Radiation Oncology

## 2022-01-11 ENCOUNTER — Inpatient Hospital Stay: Payer: Medicare (Managed Care)

## 2022-01-11 ENCOUNTER — Other Ambulatory Visit: Payer: Self-pay | Admitting: Radiation Oncology

## 2022-01-11 ENCOUNTER — Other Ambulatory Visit (HOSPITAL_COMMUNITY): Payer: Self-pay

## 2022-01-11 ENCOUNTER — Encounter: Payer: Self-pay | Admitting: Oncology

## 2022-01-11 ENCOUNTER — Ambulatory Visit: Payer: Medicare (Managed Care)

## 2022-01-11 ENCOUNTER — Other Ambulatory Visit: Payer: Self-pay

## 2022-01-11 DIAGNOSIS — C210 Malignant neoplasm of anus, unspecified: Secondary | ICD-10-CM | POA: Diagnosis not present

## 2022-01-11 LAB — RAD ONC ARIA SESSION SUMMARY
Course Elapsed Days: 36
Plan Fractions Treated to Date: 27
Plan Prescribed Dose Per Fraction: 1.8 Gy
Plan Total Fractions Prescribed: 28
Plan Total Prescribed Dose: 50.4 Gy
Reference Point Dosage Given to Date: 48.6 Gy
Reference Point Session Dosage Given: 1.8 Gy
Session Number: 27

## 2022-01-11 MED ORDER — OXYCODONE-ACETAMINOPHEN 5-325 MG PO TABS
1.0000 | ORAL_TABLET | Freq: Four times a day (QID) | ORAL | 0 refills | Status: DC | PRN
  Filled 2022-01-11: qty 30, 8d supply, fill #0

## 2022-01-11 MED ORDER — SILVER SULFADIAZINE 1 % EX CREA: TOPICAL_CREAM | Freq: Two times a day (BID) | CUTANEOUS | Status: DC

## 2022-01-12 ENCOUNTER — Other Ambulatory Visit: Payer: Self-pay

## 2022-01-12 ENCOUNTER — Encounter: Payer: Self-pay | Admitting: Radiation Oncology

## 2022-01-12 ENCOUNTER — Inpatient Hospital Stay: Payer: Medicare (Managed Care)

## 2022-01-12 ENCOUNTER — Ambulatory Visit
Admission: RE | Admit: 2022-01-12 | Discharge: 2022-01-12 | Disposition: A | Discharge status: Home / Self Care | Payer: Medicare (Managed Care) | Source: Ambulatory Visit | Attending: Radiation Oncology | Admitting: Radiation Oncology

## 2022-01-12 ENCOUNTER — Ambulatory Visit: Payer: Medicare (Managed Care)

## 2022-01-12 DIAGNOSIS — C210 Malignant neoplasm of anus, unspecified: Secondary | ICD-10-CM | POA: Diagnosis not present

## 2022-01-12 LAB — RAD ONC ARIA SESSION SUMMARY
Course Elapsed Days: 37
Plan Fractions Treated to Date: 28
Plan Prescribed Dose Per Fraction: 1.8 Gy
Plan Total Fractions Prescribed: 28
Plan Total Prescribed Dose: 50.4 Gy
Reference Point Dosage Given to Date: 50.4 Gy
Reference Point Session Dosage Given: 1.8 Gy
Session Number: 28

## 2022-01-13 ENCOUNTER — Inpatient Hospital Stay: Payer: Medicare (Managed Care)

## 2022-01-13 VITALS — BP 108/59 | HR 63 | Temp 97.7°F | Resp 18

## 2022-01-13 DIAGNOSIS — Z5111 Encounter for antineoplastic chemotherapy: Secondary | ICD-10-CM | POA: Diagnosis not present

## 2022-01-13 DIAGNOSIS — C210 Malignant neoplasm of anus, unspecified: Secondary | ICD-10-CM

## 2022-01-13 MED ORDER — HEPARIN SOD (PORK) LOCK FLUSH 100 UNIT/ML IV SOLN
500.0000 [IU] | Freq: Once | INTRAVENOUS | Status: AC | PRN
  Administered 2022-01-13: 500 [IU]

## 2022-01-13 MED ORDER — SODIUM CHLORIDE 0.9% FLUSH
10.0000 mL | INTRAVENOUS | Status: DC | PRN
  Administered 2022-01-13: 10 mL

## 2022-01-13 NOTE — Progress Notes (Signed)
PICC Removal Note: S: Patient completed treatment. PICC line scheduled to be removed today.  O: PICC line removed from right antecubital after sterile site prepped per protocol. PICC catheter tip visualized and intact. Pressure dressing applied with tegaderm tape. A: No redness, ecchymosis, edema, swelling, or drainage noted at site. P: Instructions provided on post PICC discharge care, including followup notification instructions.

## 2022-01-13 NOTE — Patient Instructions (Signed)
PICC Removal, Adult, Care After The following information offers guidance on how to care for yourself after your procedure. Your health care provider may also give you more specific instructions. If you have problems or questions, contact your health care provider. What can I expect after the procedure? After the procedure, it is common to have: Tenderness or soreness. Redness, swelling, or a scab at the place where your PICC was removed (exit site). Follow these instructions at home: For the first 24 hours after the procedure: Keep the bandage (dressing) on your exit site clean and dry. Do not remove your dressing until your health care provider tells you to do so. Do not lift anything heavy or do activities that require great effort until your health care provider says it is okay. You should avoid: Lifting weights. Doing yard work. Doing any physical activity with repetitive arm movement. Watch closely for any signs of an air bubble in the vein (air embolism). This is a rare but serious complication. Signs of an air embolism include trouble breathing, wheezing, chest pain, or a fast pulse. If you have signs of an air embolism, call 911 right away and lie down on your left side to keep the air from moving into your lungs. After 24 hours have passed:  Remove your dressing as told by your health care provider. Wash your hands with soap and water for at least 20 seconds before and after you change your dressing. If soap and water are not available, use hand sanitizer. Return to your normal activities as told by your health care provider. A small scab may develop over the exit site. Do not pick at the scab. When bathing or showering, gently wash the exit site with soap and water. Pat it dry. Watch for signs of infection, such as: A fever or chills. Swollen glands under your arm. More redness, swelling, or soreness around your arm. Blood, fluid, or pus coming from your exit site. Warmth or a  bad smell coming from your exit site. A red streak spreading away from your exit site. General instructions Take over-the-counter and prescription medicines only as told by your health care provider. Do not take any new medicines without checking with your health care provider first. If you were given an antibiotic ointment, apply it as told by your health care provider. Keep all follow-up visits. This is important. Contact a health care provider if: You have a fever or chills. You have swelling at your exit site or swollen glands under your arm. You have signs of infection at your exit site. You have soreness, redness, or swelling in your arm that gets worse. Get help right away if: You have numbness or tingling in your fingers, hand, or arm. Your arm looks blue and feels cold. You have signs of an air embolism, such as trouble breathing, wheezing, chest pain, or a fast pulse. These symptoms may be an emergency. Get medical help right away. Call 911. Do not wait to see if the symptoms will go away. Do not drive yourself to the hospital. Summary After a PICC is removed, it is common to have tenderness or soreness, redness, swelling, or a scab at the exit site. Keep the bandage (dressing) over the exit site clean and dry. Do not remove the dressing until your health care provider tells you to do so. Do not lift anything heavy or do activities that require great effort until your health care provider says it is okay. Watch closely for any signs   of an air bubble (air embolism). If you have signs of an air embolism, call 911 right away and lie down on your left side. This information is not intended to replace advice given to you by your health care provider. Make sure you discuss any questions you have with your health care provider. Document Revised: 10/05/2020 Document Reviewed: 10/05/2020 Elsevier Patient Education  2023 Elsevier Inc.  

## 2022-01-15 ENCOUNTER — Ambulatory Visit: Payer: Medicare (Managed Care)

## 2022-01-17 ENCOUNTER — Other Ambulatory Visit: Payer: Self-pay | Admitting: *Deleted

## 2022-01-17 DIAGNOSIS — C210 Malignant neoplasm of anus, unspecified: Secondary | ICD-10-CM

## 2022-01-22 ENCOUNTER — Telehealth: Payer: Self-pay

## 2022-01-22 ENCOUNTER — Other Ambulatory Visit (HOSPITAL_BASED_OUTPATIENT_CLINIC_OR_DEPARTMENT_OTHER): Payer: Self-pay

## 2022-01-22 ENCOUNTER — Encounter: Payer: Self-pay | Admitting: Oncology

## 2022-01-22 ENCOUNTER — Inpatient Hospital Stay (HOSPITAL_BASED_OUTPATIENT_CLINIC_OR_DEPARTMENT_OTHER): Payer: Medicare (Managed Care) | Admitting: Nurse Practitioner

## 2022-01-22 ENCOUNTER — Encounter: Payer: Self-pay | Admitting: Nurse Practitioner

## 2022-01-22 ENCOUNTER — Inpatient Hospital Stay: Payer: Medicare (Managed Care)

## 2022-01-22 VITALS — BP 110/78 | HR 88 | Temp 98.2°F | Resp 18 | Ht 62.0 in | Wt 129.0 lb

## 2022-01-22 DIAGNOSIS — C210 Malignant neoplasm of anus, unspecified: Secondary | ICD-10-CM

## 2022-01-22 DIAGNOSIS — Z5111 Encounter for antineoplastic chemotherapy: Secondary | ICD-10-CM | POA: Diagnosis not present

## 2022-01-22 LAB — CBC WITH DIFFERENTIAL (CANCER CENTER ONLY)
Abs Immature Granulocytes: 0.01 10*3/uL (ref 0.00–0.07)
Basophils Absolute: 0 10*3/uL (ref 0.0–0.1)
Basophils Relative: 0 %
Eosinophils Absolute: 0 10*3/uL (ref 0.0–0.5)
Eosinophils Relative: 1 %
HCT: 39 % (ref 36.0–46.0)
Hemoglobin: 13.1 g/dL (ref 12.0–15.0)
Immature Granulocytes: 0 %
Lymphocytes Relative: 12 %
Lymphs Abs: 0.3 10*3/uL — ABNORMAL LOW (ref 0.7–4.0)
MCH: 31.8 pg (ref 26.0–34.0)
MCHC: 33.6 g/dL (ref 30.0–36.0)
MCV: 94.7 fL (ref 80.0–100.0)
Monocytes Absolute: 0.3 10*3/uL (ref 0.1–1.0)
Monocytes Relative: 11 %
Neutro Abs: 2.2 10*3/uL (ref 1.7–7.7)
Neutrophils Relative %: 76 %
Platelet Count: 159 10*3/uL (ref 150–400)
RBC: 4.12 MIL/uL (ref 3.87–5.11)
RDW: 13.6 % (ref 11.5–15.5)
WBC Count: 3 10*3/uL — ABNORMAL LOW (ref 4.0–10.5)
nRBC: 0 % (ref 0.0–0.2)

## 2022-01-22 MED ORDER — NYSTATIN 100000 UNIT/ML MT SUSP
Freq: Four times a day (QID) | OROMUCOSAL | 1 refills | Status: DC | PRN
  Filled 2022-01-22: qty 280, 5d supply, fill #0

## 2022-01-22 NOTE — Telephone Encounter (Signed)
MMW called in to [facility]

## 2022-01-22 NOTE — Progress Notes (Signed)
  Lisa Nolan   Diagnosis: Anal cancer  INTERVAL HISTORY:   Lisa Nolan returns as scheduled.  She completed cycle two 5-FU/Mitomycin-C beginning 01/09/2022.  She completed the course of radiation 01/12/2022.  She denies nausea/vomiting.  She had a single episode of diarrhea.  Some constipation.  A few mouth sores.  Able to eat and drink without difficulty.  Perianal skin is "raw".  Objective:  Vital signs in last 24 hours:  Blood pressure 110/78, pulse 88, temperature 98.2 F (36.8 C), temperature source Oral, resp. rate 18, height '5\' 2"'$  (1.575 m), weight 129 lb (58.5 kg), SpO2 98 %.    HEENT: Lower lateral gumlines erythematous, few small ulcerations.  No thrush. Resp: Lungs clear bilaterally. Cardio: Regular rate and rhythm. GI: No hepatosplenomegaly. Vascular: No leg edema. Neuro: Alert and oriented. Skin: Skin over the pubic region, bilateral groin erythematous.  Labia with erythema, few areas of superficial ulceration.  Perianal skin/perineum/gluteal fold with superficial ulceration.   Lab Results:  Lab Results  Component Value Date   WBC 3.0 (L) 01/22/2022   HGB 13.1 01/22/2022   HCT 39.0 01/22/2022   MCV 94.7 01/22/2022   PLT 159 01/22/2022   NEUTROABS 2.2 01/22/2022    Imaging:  No results found.  Medications: I have reviewed the patient's current medications.  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  No hypermetabolic local regional lymphadenopathy or distant metastatic disease.  Tiny 0.3 cm solid basilar right lower lobe pulmonary nodule below PET resolution. Cycle one 5-FU/Mitomycin-C beginning 12/05/2021 Radiation  beginning 12/06/2021-01/12/2022 Cycle two 5-FU/Mitomycin-C 01/09/2022 Tobacco use Small right lower lobe pulmonary nodule on PET scan 11/20/2021-noncontrast chest CT in 3 months  Disposition: [name] appears stable.  She has completed the planned course of radiation and chemotherapy.  The skin should begin to heal over the next few weeks.  CBC from today reviewed.  Mild leukopenia, overall looks good.  She will begin a stool softener for the constipation.  She will return for a CBC and follow-up visit in 4 weeks.  We are available to see her sooner if needed.    Ned Card ANP/GNP-BC   01/22/2022  11:21 AM

## 2022-01-23 ENCOUNTER — Other Ambulatory Visit: Payer: Self-pay

## 2022-02-06 ENCOUNTER — Other Ambulatory Visit: Payer: Self-pay

## 2022-02-12 ENCOUNTER — Telehealth: Payer: Self-pay | Admitting: Oncology

## 2022-02-12 NOTE — Telephone Encounter (Signed)
Shamyra called and requested refills of oxycodone/acetaminophen and silvadene to be send to the [facility] on [address].  Also scheduled her for a one month follow up on 02/15/22 at 11:00.

## 2022-02-13 ENCOUNTER — Other Ambulatory Visit: Payer: Self-pay | Admitting: Radiation Oncology

## 2022-02-13 MED ORDER — SILVER SULFADIAZINE 1 % EX CREA: 1.0000 | TOPICAL_CREAM | Freq: Every day | CUTANEOUS | 0 refills | Status: DC

## 2022-02-13 MED ORDER — OXYCODONE-ACETAMINOPHEN 5-325 MG PO TABS: 1.0000 | ORAL_TABLET | Freq: Four times a day (QID) | ORAL | 0 refills | Status: DC | PRN

## 2022-02-14 ENCOUNTER — Encounter: Payer: Self-pay | Admitting: Oncology

## 2022-02-14 NOTE — Progress Notes (Incomplete)
  Radiation Oncology         [phone]) [phone] ________________________________  Patient Name: Lisa Nolan MRN: [id number] DOB: 1955/04/16 Referring Physician: Anastasia Pall (Profile Not Attached) Date of Service: 01/12/2022 [facility], [place]                                                        End Of Treatment Note  Diagnoses: C21.0-Malignant neoplasm of anus, unspecified  Cancer Staging: The encounter diagnosis was Anal cancer ([place]).   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 During her final weekly treatment check on 01/11/22, the patient reported pain rated at a 5/10, fatigue, skin rawness, skin redness, and decreased appetite. She denied any rectal bleeding. Pelvic exam performed on that same date revealed erythema in the groin and perineum area. Small areas of breakdown were appreciated but no signs of infection.  Plan: The patient will follow-up with radiation oncology in one month. We have given her a refill on her Silvadene. We have also refilled her Percocet given that she was running low.  ________________________________________________ -----------------------------------  Blair Promise, PhD, MD  This document serves as a record of services personally performed by Gery Pray, MD. It was created on his behalf by Roney Mans, a trained medical scribe. The creation of this record is based on the scribe's personal observations and the provider's statements to them. This document has been checked and approved by the  attending provider.

## 2022-02-14 NOTE — Telephone Encounter (Signed)
Lisa Nolan and she has aware the refills have been sent for her pain medication and silvadene.  Also advised her that her transportation has been arranged for her appointment tomorrow with Dr. Sondra Come.

## 2022-02-14 NOTE — Progress Notes (Incomplete)
Radiation Oncology         (336) [phone] ________________________________  Name: Lisa Nolan MRN: [id number]  Date: 02/15/2022  DOB: 29-Oct-1955  Follow-Up Visit Note  CC: Chesley Noon, MD  Chesley Noon, MD  No diagnosis found.  Diagnosis:  The encounter diagnosis was Anal cancer ([place]).   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 The patient tolerated radiation therapy relatively well. During her final weekly treatment check on 01/11/22, the patient reported pain rated at a 5/10, fatigue, skin rawness, skin redness, and decreased appetite. She denied any rectal bleeding. Pelvic exam performed on that same date revealed erythema in the groin and perineum area. Small areas of breakdown were appreciated but no signs of infection. She was given a refill of silvadene during her final week of treatment to apply to the above area.       In the interval since her initial consultation date, the patient has completed chemotherapy consisting of 5-FU/Mitomycin-C (x 2 cycles) from 12/05/21 through 01/09/22 under the care of Dr. Benay Spice. The patient tolerated systemic treatment well other than a single episode of diarrhea and a few mouth sores.   Pertinent imaging performed in the interval includes a PET scan on 11/20/21 which demonstrated the mild anal hypermetabolism without discrete mass (compatible with known anal malignancy). A tiny 0.3 cm solid basilar right lower lobe pulmonary nodule (below PET  resolution) was also appreciated. PET otherwise showed no evidence of hypermetabolic locoregional lymphadenopathy, or distant metastatic disease.   ***                            Allergies:  is allergic to statins and chantix [varenicline].  Meds: Current Outpatient Medications  Medication Sig Dispense Refill   silver sulfADIAZINE (SILVADENE) 1 % cream Apply 1 Application topically daily. 50 g 0   calcium-vitamin D (OSCAL) 250-125 MG-UNIT per tablet Take 2 tablets by mouth daily.     cholecalciferol (VITAMIN D) 1000 UNITS tablet Take 2,000 Units by mouth daily.  (Patient not taking: Reported on 11/08/2021)     ezetimibe (ZETIA) 10 MG tablet Take by mouth.     magic mouthwash (nystatin, diphenhydrAMINE, alum & mag hydroxide) suspension mixture Take by mouth 4 (four) times daily as needed for mouth sores. 280 mL 1   magic mouthwash (nystatin, hydrocortisone, diphenhydrAMINE) suspension Swish and spit 5 to 10 mL every 6 hours as needed. (Patient not taking: Reported on 01/04/2022) 300 mL 0   MELATONIN PO Take by mouth.     oxyCODONE-acetaminophen (PERCOCET/ROXICET) 5-325 MG tablet Take 1 tablet by mouth every 6 (six) hours as needed for severe pain. 30 tablet 0   psyllium (METAMUCIL MULTIHEALTH FIBER) 58.6 % powder OTC - NO INSTRUCTIONS GIVEN  (Patient not taking: Reported on 01/04/2022)     psyllium (METAMUCIL) 58.6 % packet Take by mouth. (Patient not taking: Reported on 01/04/2022)     rosuvastatin (CRESTOR) 10 MG tablet Take by mouth.  No current facility-administered medications for this encounter.    Physical Findings: The patient is in no acute distress. Patient is alert and oriented.  vitals were not taken for this visit. .  No significant changes. Lungs are clear to auscultation bilaterally. Heart has regular rate and rhythm. No palpable cervical, supraclavicular, or axillary adenopathy. Abdomen soft, non-tender, normal bowel sounds.   Lab Findings: Lab Results  Component Value  Date   WBC 3.0 (L) 01/22/2022   HGB 13.1 01/22/2022   HCT 39.0 01/22/2022   MCV 94.7 01/22/2022   PLT 159 01/22/2022    Radiographic Findings: No results found.  Impression:   The encounter diagnosis was Anal cancer ([place]).   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  ***  Plan:  ***   *** minutes of total time was spent for this patient encounter, including preparation, face-to-face counseling with the patient and coordination of care, physical exam, and documentation of the encounter. ____________________________________  Blair Promise, PhD, MD  This document serves as a record of services personally performed by Gery Pray, MD. It was created on his behalf by Roney Mans, a trained medical scribe. The creation of this record is based on the scribe's personal observations and the provider's statements to them. This document has been checked and approved by the attending provider.

## 2022-02-15 ENCOUNTER — Ambulatory Visit
Admission: RE | Admit: 2022-02-15 | Discharge: 2022-02-15 | Disposition: A | Discharge status: Home / Self Care | Payer: Medicare (Managed Care) | Source: Ambulatory Visit | Attending: Radiation Oncology | Admitting: Radiation Oncology

## 2022-02-15 ENCOUNTER — Encounter: Payer: Self-pay | Admitting: Radiation Oncology

## 2022-02-15 ENCOUNTER — Inpatient Hospital Stay: Payer: Medicare (Managed Care) | Attending: Oncology

## 2022-02-15 VITALS — BP 118/75 | HR 71 | Temp 97.7°F | Resp 18 | Ht 62.0 in | Wt 126.2 lb

## 2022-02-15 DIAGNOSIS — Z79899 Other long term (current) drug therapy: Secondary | ICD-10-CM | POA: Diagnosis not present

## 2022-02-15 DIAGNOSIS — Z923 Personal history of irradiation: Secondary | ICD-10-CM | POA: Diagnosis not present

## 2022-02-15 DIAGNOSIS — R3915 Urgency of urination: Secondary | ICD-10-CM | POA: Insufficient documentation

## 2022-02-15 DIAGNOSIS — C210 Malignant neoplasm of anus, unspecified: Secondary | ICD-10-CM | POA: Diagnosis present

## 2022-02-15 DIAGNOSIS — R911 Solitary pulmonary nodule: Secondary | ICD-10-CM | POA: Insufficient documentation

## 2022-02-15 NOTE — Progress Notes (Signed)
Lisa Nolan is here today for follow up post radiation to the  anus.  They completed their radiation on: 01/12/22  Does the patient complain of any of the following:  Pain:has occasional pain in her pelvis.  She is trying to not pain medicaiton unless she has severe pain. Abdominal bloating: no Diarrhea/Constipation: no Nausea/Vomiting: no Vaginal Discharge: has discharge like mucus from the rectal area which is getting to be less and less Blood in Urine or Stool: no Urinary Issues (dysuria/incomplete emptying/ incontinence/ increased frequency/urgency): no Does patient report using vaginal dilator 2-3 times a week and/or sexually active 2-3 weeks: no Post radiation skin changes: skin is healing.  Reports all open areas have closed up.  She is wondering if she should continue using Silvadene.   Additional comments if applicable: None noted.  BP 118/75 (BP Location: Right Arm, Patient Position: Sitting, Cuff Size: Normal)   Pulse 71   Temp 97.7 F (36.5 C)   Resp 18   Ht '5\' 2"'$  (1.575 m)   Wt 126 lb 3.2 oz (57.2 kg)   SpO2 99%   BMI 23.08 kg/m

## 2022-02-17 ENCOUNTER — Other Ambulatory Visit: Payer: Self-pay

## 2022-03-01 ENCOUNTER — Inpatient Hospital Stay: Payer: Medicare (Managed Care)

## 2022-03-01 ENCOUNTER — Inpatient Hospital Stay (HOSPITAL_BASED_OUTPATIENT_CLINIC_OR_DEPARTMENT_OTHER): Payer: Medicare (Managed Care) | Admitting: Oncology

## 2022-03-01 VITALS — BP 124/81 | HR 50 | Temp 98.1°F | Resp 18 | Ht 62.0 in | Wt 128.8 lb

## 2022-03-01 DIAGNOSIS — C210 Malignant neoplasm of anus, unspecified: Secondary | ICD-10-CM

## 2022-03-01 DIAGNOSIS — R3915 Urgency of urination: Secondary | ICD-10-CM | POA: Insufficient documentation

## 2022-03-01 DIAGNOSIS — R911 Solitary pulmonary nodule: Secondary | ICD-10-CM | POA: Diagnosis not present

## 2022-03-01 LAB — CBC WITH DIFFERENTIAL (CANCER CENTER ONLY)
Abs Immature Granulocytes: 0.04 10*3/uL (ref 0.00–0.07)
Basophils Absolute: 0 10*3/uL (ref 0.0–0.1)
Basophils Relative: 0 %
Eosinophils Absolute: 0.1 10*3/uL (ref 0.0–0.5)
Eosinophils Relative: 1 %
HCT: 39.7 % (ref 36.0–46.0)
Hemoglobin: 13.2 g/dL (ref 12.0–15.0)
Immature Granulocytes: 1 %
Lymphocytes Relative: 11 %
Lymphs Abs: 0.8 10*3/uL (ref 0.7–4.0)
MCH: 33.6 pg (ref 26.0–34.0)
MCHC: 33.2 g/dL (ref 30.0–36.0)
MCV: 101 fL — ABNORMAL HIGH (ref 80.0–100.0)
Monocytes Absolute: 0.7 10*3/uL (ref 0.1–1.0)
Monocytes Relative: 9 %
Neutro Abs: 5.6 10*3/uL (ref 1.7–7.7)
Neutrophils Relative %: 78 %
Platelet Count: 198 10*3/uL (ref 150–400)
RBC: 3.93 MIL/uL (ref 3.87–5.11)
RDW: 17.2 % — ABNORMAL HIGH (ref 11.5–15.5)
WBC Count: 7.1 10*3/uL (ref 4.0–10.5)
nRBC: 0 % (ref 0.0–0.2)

## 2022-03-01 MED ORDER — SILVER SULFADIAZINE 1 % EX CREA: 1.0000 | TOPICAL_CREAM | Freq: Every day | CUTANEOUS | 0 refills | Status: DC

## 2022-03-01 NOTE — Addendum Note (Signed)
Addended by: Tania Ade on: 03/01/2022 11:35 AM   Modules accepted: Orders

## 2022-03-01 NOTE — Progress Notes (Signed)
  [place] OFFICE PROGRESS NOTE   Diagnosis: Anal cancer  INTERVAL HISTORY:   Ms. Reinecke returns as scheduled.  She reports the skin changes at the perineum have healed.  No difficulty with bowel function.  No bleeding.  She has urinary urgency.  Objective:  Vital signs in last 24 hours:  Blood pressure 124/81, pulse (!) 50, temperature 98.1 F (36.7 C), temperature source Oral, resp. rate 18, height '5\' 2"'$  (1.575 m), weight 128 lb 12.8 oz (58.4 kg), SpO2 99 %.    Lymphatics: No cervical, supraclavicular, axillary, or inguinal nodes Resp: Lungs clear bilaterally Cardio: Rate and rhythm GI: No hepatosplenomegaly, nontender, no mass Vascular: No leg edema Rectal: Radiation hyperpigmentation of the perineum, no skin breakdown.  Skin tags at the anal verge, no anal rectal mass    Lab Results:  Lab Results  Component Value Date   WBC 7.1 03/01/2022   HGB 13.2 03/01/2022   HCT 39.7 03/01/2022   MCV 101.0 (H) 03/01/2022   PLT 198 03/01/2022   NEUTROABS 5.6 03/01/2022    CMP  Lab Results  Component Value Date   NA 139 01/09/2022   K 3.8 01/09/2022   CL 105 01/09/2022   CO2 27 01/09/2022   GLUCOSE 92 01/09/2022   BUN 15 01/09/2022   CREATININE 0.71 01/09/2022   CALCIUM 9.5 01/09/2022   PROT 7.5 01/09/2022   ALBUMIN 4.2 01/09/2022   AST 25 01/09/2022   ALT 21 01/09/2022   ALKPHOS 58 01/09/2022   BILITOT 0.5 01/09/2022   GFRNONAA >60 01/09/2022    Medications: I have reviewed the patient's current medications.   Assessment/Plan:  Anal cancer 10/25/2021 anoscopy showed an atypical external hemorrhoid type lesion 10/25/2021 subcutaneous fistulotomy with biopsy-invasive squamous cell carcinoma, moderately differentiated (extending to the deep margin) PET scan 11/20/2021-mild anal hypermetabolism without discrete mass correlate on the noncontrast CT images.  No hypermetabolic local regional lymphadenopathy or distant metastatic disease.  Tiny 0.3 cm  solid basilar right lower lobe pulmonary nodule below PET resolution. Cycle one 5-FU/Mitomycin-C beginning 12/05/2021 Radiation beginning 12/06/2021-01/12/2022 Cycle two 5-FU/Mitomycin-C 01/09/2022 Tobacco use Small right lower lobe pulmonary nodule on PET scan 11/20/2021-noncontrast chest CT in 3 months   Disposition: Ms. Secrist completed chemotherapy and radiation for treatment of anal cancer.  She is in clinical remission.  The radiation skin changes have resolved.  She will be scheduled for a chest CT to follow-up on a lung nodule noted on the PET in August.  She will be scheduled for an appoint with Dr. Marcello Moores for anal surveillance. Ms. Dilley will return for an office visit in 6 months.  Betsy Coder, MD  03/01/2022  9:49 AM

## 2022-03-02 ENCOUNTER — Other Ambulatory Visit: Payer: Self-pay

## 2022-03-07 ENCOUNTER — Telehealth: Payer: Self-pay

## 2022-03-07 NOTE — Telephone Encounter (Signed)
Patient called in requesting a refill on silvadene cream. Patient requesting medication be sent to [facility] on [address].

## 2022-03-15 ENCOUNTER — Telehealth: Payer: Self-pay | Admitting: *Deleted

## 2022-03-15 NOTE — Telephone Encounter (Signed)
Called patient w/CT scan at [facility] on 04/09/22 at 1:15/1:30. No prep needed. She reports needing transportation for the scan. Notified scheduler.

## 2022-04-02 ENCOUNTER — Encounter: Payer: Self-pay | Admitting: Oncology

## 2022-04-03 ENCOUNTER — Other Ambulatory Visit: Payer: Self-pay | Admitting: Oncology

## 2022-04-04 ENCOUNTER — Telehealth: Payer: Self-pay | Admitting: *Deleted

## 2022-04-04 NOTE — Telephone Encounter (Signed)
Informed patient that Dr. Benay Spice declined to refill her silver sulfadiazine cream. It is far enough our from [place] to just use OTC Desitin. If she still needs this, reach out to Dr. Sondra Come. She reports she does not really need it.

## 2022-04-09 ENCOUNTER — Ambulatory Visit (HOSPITAL_BASED_OUTPATIENT_CLINIC_OR_DEPARTMENT_OTHER)
Admission: RE | Admit: 2022-04-09 | Discharge: 2022-04-09 | Disposition: A | Discharge status: Home / Self Care | Payer: Medicare Other | Source: Ambulatory Visit | Attending: Oncology | Admitting: Oncology

## 2022-04-09 DIAGNOSIS — C210 Malignant neoplasm of anus, unspecified: Secondary | ICD-10-CM | POA: Diagnosis present

## 2022-04-18 ENCOUNTER — Telehealth: Payer: Self-pay | Admitting: *Deleted

## 2022-04-18 DIAGNOSIS — C210 Malignant neoplasm of anus, unspecified: Secondary | ICD-10-CM

## 2022-04-18 NOTE — Telephone Encounter (Signed)
Called Lisa Nolan with Dr. Gearldine Shown interpretation of CT chest: Tiny lung nodules persist and maybe very slightly larger, likely benign. Will order CT chest without contrast 1 week prior to visit in May. She agrees with this plan.

## 2022-05-14 NOTE — Progress Notes (Incomplete)
Lisa Nolan is here today for follow up post radiation to the pelvic.  They completed their radiation on: ***   Does the patient complain of any of the following:  Pain:*** Abdominal bloating: *** Diarrhea/Constipation: *** Nausea/Vomiting: *** Vaginal Discharge: *** Blood in Urine or Stool: *** Urinary Issues (dysuria/incomplete emptying/ incontinence/ increased frequency/urgency): *** Does patient report using vaginal dilator 2-3 times a week and/or sexually active 2-3 weeks: *** Post radiation skin changes: ***   Additional comments if applicable:

## 2022-05-21 ENCOUNTER — Ambulatory Visit
Admission: RE | Admit: 2022-05-21 | Discharge: 2022-05-21 | Disposition: A | Discharge status: Home / Self Care | Payer: Medicare Other | Source: Ambulatory Visit | Attending: Radiation Oncology | Admitting: Radiation Oncology

## 2022-05-21 DIAGNOSIS — C210 Malignant neoplasm of anus, unspecified: Secondary | ICD-10-CM

## 2022-05-26 NOTE — Progress Notes (Signed)
Radiation Oncology         (336) [phone] ________________________________  Name: Lisa Nolan MRN: [id number]  Date: 05/28/2022  DOB: February 19, 1956  Follow-Up Visit Note  CC: Chesley Noon, MD  Chesley Noon, MD  No diagnosis found.  Diagnosis: The encounter diagnosis was Anal cancer ([place]).   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 She was last seen here for follow-up on 02/15/22. Since her last visit, the patient followed up with Dr. Benay Spice on 03/01/22. During which time, the patient reported resolution of her previous skin changes at the perineum. She denied any concerns overall other than ongoing urinary urgency. Rectal exam performed during this visit noted radiation hyperpigmentation of the perineum without evidence of skin breakdown. Some skin tags were also noted at the anal verge. No rectal masses were appreciated.   Pertinent imaging performed in the interval includes a chest CT on 04/09/22, for follow-up of a pulmonary nodules, demonstrated: a possible slight increase in volume of the 3 mm subpleural RLL nodule; an additional RLL nodule measuring 2 mm which was not clearly demonstrated on prior imaging; and a slight increase in size of the LUL nodule, measuring 4 mm, previously measuring 3 mm. CT also showed a possible hepatic nodularity and left hepatic lobe hypertrophy, and findings compatible with chronic pancreatitis.    During her most recent follow up with Dr. Marcello Moores [facility]) on 04/24/22, the patient was noted as NED on examination. She again denied any concerns including constipation.   ***                         Allergies:  is allergic to statins and chantix [varenicline].  Meds: Current Outpatient Medications  Medication Sig Dispense Refill   calcium-vitamin D (OSCAL) 250-125 MG-UNIT per tablet Take 2 tablets by mouth daily.     cholecalciferol (VITAMIN D) 1000 UNITS tablet Take 2,000 Units by mouth daily.     ezetimibe (ZETIA) 10 MG tablet Take 10 mg by mouth every other day.     MELATONIN PO Take 2 tablets by mouth daily at 6 (six) AM.     oxyCODONE-acetaminophen (PERCOCET/ROXICET) 5-325 MG tablet Take 1 tablet by mouth every 6 (six) hours as needed for severe pain. (Patient not taking: Reported on 03/01/2022) 30 tablet 0   psyllium (METAMUCIL MULTIHEALTH FIBER) 58.6 % powder Take 2 packets by mouth daily. OTC - NO INSTRUCTIONS GIVEN     psyllium (METAMUCIL) 58.6 % packet Take by mouth.     rosuvastatin (CRESTOR) 10 MG tablet Take 10 mg by mouth every other day.     silver sulfADIAZINE (SILVADENE) 1 % cream Apply 1 Application topically daily. 50 g 0   No current facility-administered medications for this encounter.    Physical Findings: The patient is in no acute distress. Patient is alert and oriented.  vitals were not taken for this visit. .  No significant changes. Lungs are clear to auscultation bilaterally. Heart has regular rate and rhythm. No palpable cervical, supraclavicular, or axillary adenopathy. Abdomen soft, non-tender, normal bowel sounds.   Lab Findings: Lab Results  Component Value Date   WBC  7.1 03/01/2022   HGB 13.2 03/01/2022   HCT 39.7 03/01/2022   MCV 101.0 (H) 03/01/2022   PLT 198 03/01/2022    Radiographic Findings: No results found.  Impression: The encounter diagnosis was Anal cancer ([place]).   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  ***  Plan:  ***   *** minutes of total time was spent for this patient encounter, including preparation, face-to-face  counseling with the patient and coordination of care, physical exam, and documentation of the encounter. ____________________________________  Blair Promise, PhD, MD  This document serves as a record of services personally performed by Gery Pray, MD. It was created on his behalf by Roney Mans, a trained medical scribe. The creation of this record is based on the scribe's personal observations and the provider's statements to them. This document has been checked and approved by the attending provider.

## 2022-05-28 ENCOUNTER — Encounter: Payer: Self-pay | Admitting: Radiation Oncology

## 2022-05-28 ENCOUNTER — Inpatient Hospital Stay: Payer: Medicare Other | Attending: Radiation Oncology

## 2022-05-28 ENCOUNTER — Ambulatory Visit
Admission: RE | Admit: 2022-05-28 | Discharge: 2022-05-28 | Disposition: A | Discharge status: Home / Self Care | Payer: Medicare Other | Source: Ambulatory Visit | Attending: Radiation Oncology | Admitting: Radiation Oncology

## 2022-05-28 VITALS — BP 106/69 | HR 88 | Temp 97.7°F | Resp 18 | Ht 62.0 in | Wt 128.8 lb

## 2022-05-28 DIAGNOSIS — Z923 Personal history of irradiation: Secondary | ICD-10-CM | POA: Diagnosis not present

## 2022-05-28 DIAGNOSIS — R918 Other nonspecific abnormal finding of lung field: Secondary | ICD-10-CM | POA: Insufficient documentation

## 2022-05-28 DIAGNOSIS — Z79899 Other long term (current) drug therapy: Secondary | ICD-10-CM | POA: Insufficient documentation

## 2022-05-28 DIAGNOSIS — C210 Malignant neoplasm of anus, unspecified: Secondary | ICD-10-CM | POA: Insufficient documentation

## 2022-05-28 MED ORDER — SILVER SULFADIAZINE 1 % EX CREA: TOPICAL_CREAM | Freq: Two times a day (BID) | CUTANEOUS | Status: DC

## 2022-05-28 NOTE — Progress Notes (Signed)
Lisa Nolan is here today for follow up post radiation to the anus  They completed their radiation on:   Radiation Treatment Dates: 12/06/2021 through 01/12/2022   Does the patient complain of any of the following:  Pain: Soreness in her rectal area Abdominal bloating: None Diarrhea/Constipation: diarrhea sometime Nausea/Vomiting: none Vaginal Discharge: none Blood in Urine or Stool: none Urinary Issues (dysuria/incomplete emptying/ incontinence/ increased frequency/urgency): Reports some urgency with urination  Post radiation skin changes: Patient reports some sore at her radiation site.   Additional comments if applicable:  Vitals:   [phone]  BP: 106/69  Pulse: 88  Resp: 18  Temp: 97.7 F (36.5 C)  SpO2: 100%  Weight: 58.4 kg  Height: '5\' 2"'$  (1.575 m)

## 2022-06-28 ENCOUNTER — Telehealth: Payer: Self-pay

## 2022-06-28 NOTE — Telephone Encounter (Signed)
Patient called concerned about acute pain she's been experiencing over the past two weeks. She reports generalized pain that is more intense to her legs, back, and sides/ribs. She states it is extremely difficult for her to get up in the mornings and that even the slightest touch (like her dog's tail brushing her side) is very painful. She stated that she saw her PCP earlier this month and was not having these symptoms, so she is worried they are related to her cancer. Informed patient I would reach out to both her medical and radiation oncologists for direction, and call her back tomorrow with an update according to their recommendations. Patient verbalized understanding and appreciation and denied any other needs at this time

## 2022-06-29 NOTE — Telephone Encounter (Signed)
Called and spoke with patient and relayed that per Dr. Benay Spice, patient's symptoms are not likely related to her history or anal cancer and she should reach out to her PCP. Confirmed with patient that she had her PCP's office contact information and will reach out to them directly. Encouraged patient to reach back out to our office or Dr. Gearldine Shown if PCP is unable to assist, or she has any other questions/concerns. Patient verbalized understanding and agreement, and denied any other needs at this time

## 2022-08-22 ENCOUNTER — Ambulatory Visit (HOSPITAL_BASED_OUTPATIENT_CLINIC_OR_DEPARTMENT_OTHER)
Admission: RE | Admit: 2022-08-22 | Discharge: 2022-08-22 | Disposition: A | Discharge status: Home / Self Care | Payer: Medicare Other | Source: Ambulatory Visit | Attending: Oncology | Admitting: Oncology

## 2022-08-22 ENCOUNTER — Encounter (HOSPITAL_BASED_OUTPATIENT_CLINIC_OR_DEPARTMENT_OTHER): Payer: Self-pay

## 2022-08-22 DIAGNOSIS — C210 Malignant neoplasm of anus, unspecified: Secondary | ICD-10-CM | POA: Insufficient documentation

## 2022-08-30 ENCOUNTER — Inpatient Hospital Stay: Payer: Medicare Other | Admitting: Nurse Practitioner

## 2022-08-30 ENCOUNTER — Other Ambulatory Visit: Payer: Self-pay

## 2022-09-10 ENCOUNTER — Encounter: Payer: Self-pay | Admitting: Nurse Practitioner

## 2022-09-10 ENCOUNTER — Inpatient Hospital Stay: Payer: Medicare Other | Attending: Nurse Practitioner | Admitting: Nurse Practitioner

## 2022-09-10 VITALS — BP 130/77 | HR 79 | Temp 98.1°F | Resp 18 | Ht 62.0 in | Wt 126.8 lb

## 2022-09-10 DIAGNOSIS — C210 Malignant neoplasm of anus, unspecified: Secondary | ICD-10-CM | POA: Diagnosis present

## 2022-09-10 DIAGNOSIS — R918 Other nonspecific abnormal finding of lung field: Secondary | ICD-10-CM | POA: Diagnosis not present

## 2022-09-10 DIAGNOSIS — Z720 Tobacco use: Secondary | ICD-10-CM | POA: Insufficient documentation

## 2022-09-10 NOTE — Progress Notes (Signed)
  [facility] OFFICE PROGRESS NOTE   Diagnosis: Anal cancer  INTERVAL HISTORY:   Lisa Nolan returns as scheduled.  Bowels overall moving regularly.  She occasionally has diarrhea.  Appetite varies.  Her weight is stable.  She did not have transportation to her scheduled appointment with Dr. Maisie Fus in April of this year.  Objective:  Vital signs in last 24 hours:  Blood pressure 130/77, pulse 79, temperature 98.1 F (36.7 C), temperature source Oral, resp. rate 18, height 5\' 2"  (1.575 m), weight 126 lb 12.8 oz (57.5 kg), SpO2 98 %.    Lymphatics: No palpable cervical, supraclavicular, axillary or inguinal lymph nodes. Resp: Lungs clear bilaterally. Cardio: Regular rate and rhythm. GI: No hepatosplenomegaly.  No mass. Vascular: No leg edema.     Lab Results:  Lab Results  Component Value Date   WBC 7.1 03/01/2022   HGB 13.2 03/01/2022   HCT 39.7 03/01/2022   MCV 101.0 (H) 03/01/2022   PLT 198 03/01/2022   NEUTROABS 5.6 03/01/2022    Imaging:  No results found.  Medications: I have reviewed the patient's current medications.  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  No hypermetabolic local regional lymphadenopathy or distant metastatic disease.  Tiny 0.3 cm solid basilar right lower lobe pulmonary nodule below PET resolution. Cycle one 5-FU/Mitomycin-C beginning 12/05/2021 Radiation beginning 12/06/2021-01/12/2022 Cycle two 5-FU/Mitomycin-C 01/09/2022 Tobacco use Small right lower lobe pulmonary nodule on PET scan 11/20/2021-noncontrast chest CT in 3 months; CT chest 04/09/2022-stable to minimal increase in size of subpleural right lower lobe nodule measuring 3  mm; 2 mm right lower lobe pulmonary nodule not as clearly demonstrated on the prior PET-CT; 4 mm left upper lobe pulmonary nodules appears to be mildly increased in size.  CT chest 08/24/2022-3 mm left upper lobe pulmonary nodule is new in the interval, potentially a focus of airway impaction; stable 3 mm right lower lobe pulmonary nodule; posterior left apical nodule has almost resolved in the interval; paraspinal right lower lobe nodule has resolved.  Disposition: [name] remains in clinical remission from anal cancer.  We provided her with the phone number for Dr. Maurine Minister office.  She will schedule an appointment for anal surveillance.  Follow-up chest CT showed 2 nodules had resolved or nearly resolved, stable right lower lobe nodule, new left upper lobe nodule.  Plan for next chest CT in 6 months.  She will return for follow-up in 6 months.  She will contact the office in the interim with any problems.    Lonna Cobb ANP/GNP-BC   09/10/2022  11:59 AM

## 2022-09-11 ENCOUNTER — Other Ambulatory Visit: Payer: Self-pay

## 2022-11-15 ENCOUNTER — Encounter: Payer: Self-pay | Admitting: Oncology

## 2022-11-16 ENCOUNTER — Encounter: Payer: Self-pay | Admitting: Oncology

## 2023-01-19 ENCOUNTER — Other Ambulatory Visit: Payer: Self-pay

## 2023-02-07 ENCOUNTER — Encounter: Payer: Self-pay | Admitting: Internal Medicine

## 2023-03-12 ENCOUNTER — Ambulatory Visit (HOSPITAL_BASED_OUTPATIENT_CLINIC_OR_DEPARTMENT_OTHER): Payer: Medicare Other

## 2023-03-12 ENCOUNTER — Inpatient Hospital Stay: Payer: Medicare Other | Attending: Oncology | Admitting: Oncology

## 2023-03-12 DIAGNOSIS — K6289 Other specified diseases of anus and rectum: Secondary | ICD-10-CM | POA: Insufficient documentation

## 2023-03-12 DIAGNOSIS — R911 Solitary pulmonary nodule: Secondary | ICD-10-CM | POA: Insufficient documentation

## 2023-03-12 DIAGNOSIS — C210 Malignant neoplasm of anus, unspecified: Secondary | ICD-10-CM | POA: Insufficient documentation

## 2023-03-12 DIAGNOSIS — F17210 Nicotine dependence, cigarettes, uncomplicated: Secondary | ICD-10-CM | POA: Insufficient documentation

## 2023-03-13 ENCOUNTER — Telehealth: Payer: Self-pay | Admitting: *Deleted

## 2023-03-13 ENCOUNTER — Other Ambulatory Visit: Payer: Self-pay

## 2023-03-13 NOTE — Telephone Encounter (Signed)
Provided appointment information for CT chest and OV on 03/28/23

## 2023-03-19 ENCOUNTER — Other Ambulatory Visit: Payer: Self-pay

## 2023-03-28 ENCOUNTER — Other Ambulatory Visit: Payer: Self-pay | Admitting: *Deleted

## 2023-03-28 ENCOUNTER — Inpatient Hospital Stay: Payer: Medicare Other | Admitting: Oncology

## 2023-03-28 ENCOUNTER — Other Ambulatory Visit (HOSPITAL_BASED_OUTPATIENT_CLINIC_OR_DEPARTMENT_OTHER): Payer: Self-pay

## 2023-03-28 ENCOUNTER — Encounter: Payer: Self-pay | Admitting: Oncology

## 2023-03-28 ENCOUNTER — Ambulatory Visit (HOSPITAL_BASED_OUTPATIENT_CLINIC_OR_DEPARTMENT_OTHER)
Admission: RE | Admit: 2023-03-28 | Discharge: 2023-03-28 | Disposition: A | Discharge status: Home / Self Care | Payer: Medicare Other | Source: Ambulatory Visit | Attending: Nurse Practitioner | Admitting: Nurse Practitioner

## 2023-03-28 VITALS — BP 127/84 | HR 76 | Temp 98.1°F | Resp 18 | Ht 62.0 in | Wt 131.2 lb

## 2023-03-28 DIAGNOSIS — F17210 Nicotine dependence, cigarettes, uncomplicated: Secondary | ICD-10-CM | POA: Diagnosis not present

## 2023-03-28 DIAGNOSIS — R911 Solitary pulmonary nodule: Secondary | ICD-10-CM | POA: Diagnosis not present

## 2023-03-28 DIAGNOSIS — K6289 Other specified diseases of anus and rectum: Secondary | ICD-10-CM | POA: Diagnosis not present

## 2023-03-28 DIAGNOSIS — C210 Malignant neoplasm of anus, unspecified: Secondary | ICD-10-CM | POA: Insufficient documentation

## 2023-03-28 MED ORDER — SILVER SULFADIAZINE 1 % EX CREA: 1.0000 | TOPICAL_CREAM | Freq: Every day | CUTANEOUS | 0 refills | Status: DC

## 2023-03-28 MED ORDER — SILVER SULFADIAZINE 1 % EX CREA
1.0000 | TOPICAL_CREAM | Freq: Every day | CUTANEOUS | 0 refills | Status: DC
  Filled 2023-03-28 (×2): qty 50, 50d supply, fill #0

## 2023-03-28 NOTE — Progress Notes (Signed)
[facility] OFFICE PROGRESS NOTE   Diagnosis: Anal cancer   INTERVAL HISTORY:   Ms. Jurado returns as scheduled.  She continues to have burning at the perineum with bowel movements.  She says this is variable depending on food that she eats.  Silvadene cream helps.  She saw Dr. Maisie Fus in October.  There was no evidence of recurrent anal cancer.  She continues smoking.  Ms. Kies complains of back pain after falling on her deck.  She also has chronic back pain.  Objective:  Vital signs in last 24 hours:  Blood pressure 127/84, pulse 76, temperature 98.1 F (36.7 C), temperature source Temporal, resp. rate 18, height 5\' 2"  (1.575 m), weight 131 lb 3.2 oz (59.5 kg), SpO2 97%.    Lymphatics: No cervical, supraclavicular, axillary, or inguinal nodes Resp: Lungs clear bilaterally Cardio: Regular rate and rhythm GI: No hepatosplenomegaly, no mass, nontender, external rectal exam reveals radiation changes at the perineum with hyperpigmentation/hypopigmentation, soft external hemorrhoids, no mass at the anal margin Vascular: No leg edema   Lab Results:  Lab Results  Component Value Date   WBC 7.1 03/01/2022   HGB 13.2 03/01/2022   HCT 39.7 03/01/2022   MCV 101.0 (H) 03/01/2022   PLT 198 03/01/2022   NEUTROABS 5.6 03/01/2022    CMP  Lab Results  Component Value Date   NA 139 01/09/2022   K 3.8 01/09/2022   CL 105 01/09/2022   CO2 27 01/09/2022   GLUCOSE 92 01/09/2022   BUN 15 01/09/2022   CREATININE 0.71 01/09/2022   CALCIUM 9.5 01/09/2022   PROT 7.5 01/09/2022   ALBUMIN 4.2 01/09/2022   AST 25 01/09/2022   ALT 21 01/09/2022   ALKPHOS 58 01/09/2022   BILITOT 0.5 01/09/2022   GFRNONAA >60 01/09/2022     Medications: I have reviewed the patient's current medications.   Assessment/Plan: Anal cancer 10/25/2021 anoscopy showed an atypical external hemorrhoid type lesion 10/25/2021 subcutaneous fistulotomy with biopsy-invasive squamous cell  carcinoma, moderately differentiated (extending to the deep margin) PET scan 11/20/2021-mild anal hypermetabolism without discrete mass correlate on the noncontrast CT images.  No hypermetabolic local regional lymphadenopathy or distant metastatic disease.  Tiny 0.3 cm solid basilar right lower lobe pulmonary nodule below PET resolution. Cycle one 5-FU/Mitomycin-C beginning 12/05/2021 Radiation beginning 12/06/2021-01/12/2022 Cycle two 5-FU/Mitomycin-C 01/09/2022 Tobacco use Small right lower lobe pulmonary nodule on PET scan 11/20/2021-noncontrast chest CT in 3 months; CT chest 04/09/2022-stable to minimal increase in size of subpleural right lower lobe nodule measuring 3 mm; 2 mm right lower lobe pulmonary nodule not as clearly demonstrated on the prior PET-CT; 4 mm left upper lobe pulmonary nodules appears to be mildly increased in size.  CT chest 08/24/2022-3 mm left upper lobe pulmonary nodule is new in the interval, potentially a focus of airway impaction; stable 3 mm right lower lobe pulmonary nodule; posterior left apical nodule has almost resolved in the interval; paraspinal right lower lobe nodule has resolved.    Disposition: Ms. Beagley remains in clinical remission from anal cancer.  She will continue annual surveillance with Dr. Maisie Fus.  She is scheduled to see Dr. Maisie Fus next month.  I recommended she try over-the-counter hemorrhoid creams for the anal discomfort.  The discomfort is likely secondary to hemorrhoids.  I refilled her prescription for Silvadene cream.  She had a follow-up chest CT today.  The "new "left upper lobe nodule noted in May is not apparent by my review.  We will contact her when the  final report is available.  Ms. Papania will return for an office visit in 9 months.  I encouraged her to discontinue smoking.  Thornton Papas, MD  03/28/2023  12:26 PM

## 2023-03-29 ENCOUNTER — Other Ambulatory Visit: Payer: Self-pay

## 2023-04-12 ENCOUNTER — Telehealth: Payer: Self-pay

## 2023-04-12 NOTE — Telephone Encounter (Signed)
Patient gave verbal understanding and had no questions or concerns.

## 2023-04-12 NOTE — Telephone Encounter (Signed)
-----   Message from Thornton Papas sent at 04/11/2023  7:44 PM EST ----- Please call patient, CT is negative for cancer, 1 tiny nodule has resolved, another is stable, f/u as scheduled

## 2023-10-08 ENCOUNTER — Telehealth: Payer: Self-pay

## 2023-10-08 NOTE — Telephone Encounter (Signed)
 Kirstin PA from [facility] called stating that this mutual Cancer patient needs transportation set up.  Stated that [facility] arranges it for the patient. Patient is already established with [facility], given name/number to contact to set it up.

## 2023-10-24 ENCOUNTER — Encounter: Payer: Self-pay | Admitting: Oncology

## 2023-10-28 ENCOUNTER — Ambulatory Visit

## 2023-10-28 ENCOUNTER — Telehealth: Payer: Self-pay

## 2023-10-28 NOTE — Telephone Encounter (Signed)
 Dr. Avram,   I attempted to do this pts PV apt. She made me aware that she does not have a care partner that can drive her here or sit with her. Pt was planning to use public transportation. I spoke to the pt about apt mate but this is not doable due to the out of pocket cost. She has a hx of rectal cancer. Please advise on how we can proceed or recommendations. Her colonoscopy for 11/06/23 has been cancelled until addressed   Thank you

## 2023-10-29 NOTE — Telephone Encounter (Signed)
 She has to have a care partner to do the procedure  If she goes to a church there could be a Lobbyist that could help her somehow  Maybe the cancer center social worker could also help - please ask them    Note she has hx anal cancer not rectal cancer (they are different) and the follow-up is for hx colon polyps

## 2023-10-29 NOTE — Telephone Encounter (Signed)
 Per pt she has found a ride. PV and colonoscopy have been r/s.

## 2023-11-06 ENCOUNTER — Encounter: Admitting: Internal Medicine

## 2023-11-29 ENCOUNTER — Ambulatory Visit

## 2023-11-29 ENCOUNTER — Telehealth: Payer: Self-pay

## 2023-11-29 NOTE — Telephone Encounter (Signed)
 Multiple attempts made to reach patient- NO answer-messages left for patient to call back to the office to reschedule PV appt- if patient fails to call back to the office prior to end of business day ---PV and procedure appts will be cancelled and a no show letter will be sent to the patient;

## 2023-11-29 NOTE — Telephone Encounter (Signed)
 Attempted to reach patient again concerning her PV appt; no answer- left message for patient to call back to the office to reschedule her PV and procedure appts;  Appts cancelled and no show letter sent via MyChart;

## 2023-12-09 ENCOUNTER — Encounter: Payer: Self-pay | Admitting: Internal Medicine

## 2023-12-10 ENCOUNTER — Other Ambulatory Visit: Payer: Self-pay

## 2023-12-12 ENCOUNTER — Encounter: Admitting: Internal Medicine

## 2023-12-27 ENCOUNTER — Inpatient Hospital Stay: Payer: Medicare Other | Attending: Oncology | Admitting: Oncology

## 2023-12-27 VITALS — BP 124/80 | HR 84 | Temp 98.2°F | Resp 18 | Ht 62.0 in | Wt 136.8 lb

## 2023-12-27 DIAGNOSIS — R911 Solitary pulmonary nodule: Secondary | ICD-10-CM | POA: Diagnosis not present

## 2023-12-27 DIAGNOSIS — C210 Malignant neoplasm of anus, unspecified: Secondary | ICD-10-CM | POA: Diagnosis not present

## 2023-12-27 DIAGNOSIS — Z720 Tobacco use: Secondary | ICD-10-CM | POA: Insufficient documentation

## 2023-12-27 MED ORDER — SILVER SULFADIAZINE 1 % EX CREA: 1.0000 | TOPICAL_CREAM | Freq: Every day | CUTANEOUS | 2 refills | Status: AC

## 2023-12-27 NOTE — Progress Notes (Signed)
  [facility] OFFICE PROGRESS NOTE   Diagnosis: Anal cancer  INTERVAL HISTORY:   Ms. Lisa Nolan returns as scheduled.  He continues to have burning discomfort at the perineum, especially after bowel movements.  Silvadene  cream helps.  She saw Dr. Debby earlier this week.  There is no evidence of recurrent anal cancer.  She continues smoking.  Objective:  Vital signs in last 24 hours:  Blood pressure 124/80, pulse 84, temperature 98.2 F (36.8 C), temperature source Temporal, resp. rate 18, height 5' 2 (1.575 m), weight 136 lb 12.8 oz (62.1 kg), SpO2 100%.  Lymphatics: No cervical, supraclavicular, axillary, or inguinal nodes Resp: Lungs clear bilaterally Cardio: Regular rate and rhythm GI: No hepatosplenomegaly Vascular: No leg edema Rectal: Radiation telangiectasias at the perineum, no skin breakdown, skin tags at the anal verge  Portacath/PICC-without erythema  Lab Results:  Lab Results  Component Value Date   WBC 7.1 03/01/2022   HGB 13.2 03/01/2022   HCT 39.7 03/01/2022   MCV 101.0 (H) 03/01/2022   PLT 198 03/01/2022   NEUTROABS 5.6 03/01/2022    CMP  Lab Results  Component Value Date   NA 139 01/09/2022   K 3.8 01/09/2022   CL 105 01/09/2022   CO2 27 01/09/2022   GLUCOSE 92 01/09/2022   BUN 15 01/09/2022   CREATININE 0.71 01/09/2022   CALCIUM 9.5 01/09/2022   PROT 7.5 01/09/2022   ALBUMIN 4.2 01/09/2022   AST 25 01/09/2022   ALT 21 01/09/2022   ALKPHOS 58 01/09/2022   BILITOT 0.5 01/09/2022   GFRNONAA >60 01/09/2022   Medications: I have reviewed the patient's current medications.   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  No  hypermetabolic local regional lymphadenopathy or distant metastatic disease.  Tiny 0.3 cm solid basilar right lower lobe pulmonary nodule below PET resolution. Cycle one 5-FU/Mitomycin -C beginning 12/05/2021 Radiation beginning 12/06/2021-01/12/2022 Cycle two 5-FU/Mitomycin -C 01/09/2022 Tobacco use Small right lower lobe pulmonary nodule on PET scan 11/20/2021-noncontrast chest CT in 3 months; CT chest 04/09/2022-stable to minimal increase in size of subpleural right lower lobe nodule measuring 3 mm; 2 mm right lower lobe pulmonary nodule not as clearly demonstrated on the prior PET-CT; 4 mm left upper lobe pulmonary nodules appears to be mildly increased in size.  CT chest 08/24/2022-3 mm left upper lobe pulmonary nodule is new in the interval, potentially a focus of airway impaction; stable 3 mm right lower lobe pulmonary nodule; posterior left apical nodule has almost resolved in the interval; paraspinal right lower lobe nodule has resolved. CT chest 03/28/2023: Resolution of 3 mm left upper lobe nodule, stable posterior right costophrenic nodule, no new nodule or mass      Disposition: Lisa Nolan is in clinical remission from anal cancer.  She continues anal surveillance with Dr. Debby.  She will return for an office visit in 9 months.  She says Silvadene  cream helps the perineal burning.  We refilled her prescription for Silvadene . She has a history of lung nodules and continues smoking.  I will refer her for a screening chest CT in December.  Arley Hof, MD  12/27/2023  12:32 PM

## 2023-12-31 ENCOUNTER — Other Ambulatory Visit: Payer: Self-pay

## 2024-01-01 ENCOUNTER — Encounter

## 2024-01-01 ENCOUNTER — Telehealth: Payer: Self-pay

## 2024-01-01 NOTE — Telephone Encounter (Signed)
 Patient did not sign onto MyChart and missed Virtual Pre Visit appointment. Rescheduled for tomorrow on 10/2 @ 11AM.

## 2024-01-02 ENCOUNTER — Encounter: Payer: Self-pay | Admitting: Internal Medicine

## 2024-01-02 ENCOUNTER — Ambulatory Visit (AMBULATORY_SURGERY_CENTER)

## 2024-01-02 VITALS — Ht 62.0 in | Wt 137.0 lb

## 2024-01-02 DIAGNOSIS — Z85048 Personal history of other malignant neoplasm of rectum, rectosigmoid junction, and anus: Secondary | ICD-10-CM

## 2024-01-02 MED ORDER — NA SULFATE-K SULFATE-MG SULF 17.5-3.13-1.6 GM/177ML PO SOLN: 1.0000 | Freq: Once | ORAL | 0 refills | Status: AC

## 2024-01-02 NOTE — Progress Notes (Signed)

## 2024-01-14 NOTE — Progress Notes (Unsigned)
 [facility] History and Physical   Primary Care Physician:  Sophronia Ozell BROCKS, MD   Reason for Procedure:    Encounter Diagnoses  Name Primary?   Hx of adenomatous polyp of colon Yes   History of anal cancer      Plan:    colonoscopy     HPI: Lisa Nolan is a 68 y.o. female w/ hx anal cancer and colon polyps here for surveillance exam.  08/2012 1 diminutive adenoma and 3 diminutive hyperplastic polyps 01/03/2018 5 diminutive polyps removed 1 adenoma others hyperplastic  Past Medical History:  Diagnosis Date   Allergy     mild   Anal cancer (HCC)    Anemia    as child   Anxiety    Chronic back pain    Depression    Hepatitis C    Hyperlipidemia    Personal history of colonic adenoma 09/24/2012    Past Surgical History:  Procedure Laterality Date   COLONOSCOPY     POLYPECTOMY     x2   TONSILLECTOMY  1963     Current Outpatient Medications  Medication Sig Dispense Refill   calcium-vitamin D (OSCAL) 250-125 MG-UNIT per tablet Take 2 tablets by mouth daily.     cholecalciferol (VITAMIN D) 1000 UNITS tablet Take 2,000 Units by mouth daily. (Patient taking differently: Take 1,000 Units by mouth daily.)     ezetimibe (ZETIA) 10 MG tablet Take 10 mg by mouth every other day.     MELATONIN PO Take 2 tablets by mouth daily at 6 (six) AM. (Patient taking differently: Take 2 tablets by mouth daily as needed.)     rosuvastatin (CRESTOR) 10 MG tablet Take 10 mg by mouth every other day.     silver  sulfADIAZINE  (SILVADENE ) 1 % cream Apply 1 Application topically daily. 50 g 2   No current facility-administered medications for this visit.    Allergies as of 01/15/2024 - Review Complete 01/02/2024  Allergen Reaction Noted   Statins Anaphylaxis 12/20/2016   Chantix [varenicline] Other (See Comments) 07/25/2012   Nicotine Other (See Comments) 08/29/2022    Family History  Problem Relation Age of Onset   Diabetes Mother    Diabetes Sister    Cancer  Maternal Aunt    Colon cancer Maternal Aunt 57   Colon polyps Maternal Aunt    Breast cancer Neg Hx    Esophageal cancer Neg Hx    Rectal cancer Neg Hx    Stomach cancer Neg Hx     Social History   Socioeconomic History   Marital status: Single    Spouse name: Not on file   Number of children: Not on file   Years of education: Not on file   Highest education level: Not on file  Occupational History   Not on file  Tobacco Use   Smoking status: Every Day    Current packs/day: 1.00    Average packs/day: 0.8 packs/day for 45.8 years (35.8 ttl pk-yrs)    Types: Cigarettes    Start date: 11/13/1977    Last attempt to quit: 11/13/2017   Smokeless tobacco: Never  Vaping Use   Vaping status: Never Used  Substance and Sexual Activity   Alcohol use: Not Currently    Comment: glass of wine a few times/week   Drug use: No   Sexual activity: Yes    Partners: Male  Other Topics Concern   Not on file  Social History Narrative   Lives in [place] with husband.  Social Drivers of Corporate investment banker Strain: Low Risk  (09/18/2023)   Received from [facility]   Overall Financial Resource Strain (CARDIA)    Difficulty of Paying Living Expenses: Not hard at all  Food Insecurity: No Food Insecurity (09/18/2023)   [facility]   Hunger Vital Sign    Within the past 12 months, you worried that your food would run out before you got the money to buy more.: Never true    Within the past 12 months, the food you bought just didn't last and you didn't have money to get more.: Never true  Transportation Needs: No Transportation Needs (09/18/2023)   Received from [facility] - Transportation    Lack of Transportation (Medical): No    Lack of Transportation (Non-Medical): No  Physical Activity: Unknown (09/18/2023)   Received from [facility]   Exercise Vital Sign    On average, how many days per week do you engage in moderate to strenuous exercise (like  a brisk walk)?: 0 days    Minutes of Exercise per Session: Not on file  Stress: No Stress Concern Present (09/18/2023)   Received from [facility] - Occupational Stress Questionnaire    Feeling of Stress : Not at all  Social Connections: Moderately Integrated (09/18/2023)   Received from [facility]   Social Network    How would you rate your social network (family, work, friends)?: Adequate participation with social networks  Intimate Partner Violence: Not At Risk (09/18/2023)   Received from [facility]   HITS    Over the last 12 months how often did your partner physically hurt you?: Never    Over the last 12 months how often did your partner insult you or talk down to you?: Never    Over the last 12 months how often did your partner threaten you with physical harm?: Never    Over the last 12 months how often did your partner scream or curse at you?: Never    Review of Systems: Positive for *** All other review of systems negative except as mentioned in the HPI.  Physical Exam: Vital signs There were no vitals taken for this visit.  General:   Alert,  Well-developed, well-nourished, pleasant and cooperative in NAD Lungs:  Clear throughout to auscultation.   Heart:  Regular rate and rhythm; no murmurs, clicks, rubs,  or gallops. Abdomen:  Soft, nontender and nondistended. Normal bowel sounds.   Neuro/Psych:  Alert and cooperative. Normal mood and affect. A and O x 3   @Lisa Nolan  Lisa Commander, MD, [facility] [phone] (pager) 01/14/2024 8:41 PM@

## 2024-01-15 ENCOUNTER — Ambulatory Visit (AMBULATORY_SURGERY_CENTER): Admitting: Internal Medicine

## 2024-01-15 ENCOUNTER — Encounter: Payer: Self-pay | Admitting: Internal Medicine

## 2024-01-15 VITALS — BP 113/62 | HR 73 | Temp 98.4°F | Resp 25 | Ht 62.0 in | Wt 137.0 lb

## 2024-01-15 DIAGNOSIS — K5520 Angiodysplasia of colon without hemorrhage: Secondary | ICD-10-CM | POA: Diagnosis not present

## 2024-01-15 DIAGNOSIS — Z85048 Personal history of other malignant neoplasm of rectum, rectosigmoid junction, and anus: Secondary | ICD-10-CM

## 2024-01-15 DIAGNOSIS — Z1211 Encounter for screening for malignant neoplasm of colon: Secondary | ICD-10-CM | POA: Diagnosis not present

## 2024-01-15 DIAGNOSIS — K635 Polyp of colon: Secondary | ICD-10-CM | POA: Diagnosis not present

## 2024-01-15 DIAGNOSIS — Z860101 Personal history of adenomatous and serrated colon polyps: Secondary | ICD-10-CM

## 2024-01-15 DIAGNOSIS — D120 Benign neoplasm of cecum: Secondary | ICD-10-CM | POA: Diagnosis not present

## 2024-01-15 DIAGNOSIS — D122 Benign neoplasm of ascending colon: Secondary | ICD-10-CM

## 2024-01-15 DIAGNOSIS — K5730 Diverticulosis of large intestine without perforation or abscess without bleeding: Secondary | ICD-10-CM | POA: Diagnosis not present

## 2024-01-15 MED ORDER — SODIUM CHLORIDE 0.9 % IV SOLN
500.0000 mL | Freq: Once | INTRAVENOUS | Status: DC
Start: 2024-01-15 — End: 2024-01-15

## 2024-01-15 NOTE — Progress Notes (Signed)
 Called to room to assist during endoscopic procedure.  Patient ID and intended procedure confirmed with present staff. Received instructions for my participation in the procedure from the performing physician.

## 2024-01-15 NOTE — Patient Instructions (Addendum)
 I found and removed 2 tiny polyps.  I will let you know pathology results and when to have another routine colonoscopy by mail and/or My Chart.   Handouts given: Polyps, Diverticulosis, Hemorrhoids Resume previous diet. Continue present medications.  Await pathology results. Repeat colonoscopy is recommended for surveillance based on pathology results.  YOU HAD AN ENDOSCOPIC PROCEDURE TODAY AT THE [facility]:   Refer to the procedure report that was given to you for any specific questions about what was found during the examination.  If the procedure report does not answer your questions, please call your gastroenterologist to clarify.  If you requested that your care partner not be given the details of your procedure findings, then the procedure report has been included in a sealed envelope for you to review at your convenience later.  YOU SHOULD EXPECT: Some feelings of bloating in the abdomen. Passage of more gas than usual.  Walking can help get rid of the air that was put into your GI tract during the procedure and reduce the bloating. If you had a lower endoscopy (such as a colonoscopy or flexible sigmoidoscopy) you may notice spotting of blood in your stool or on the toilet paper. If you underwent a bowel prep for your procedure, you may not have a normal bowel movement for a few days.  Please Note:  You might notice some irritation and congestion in your nose or some drainage.  This is from the oxygen used during your procedure.  There is no need for concern and it should clear up in a day or so.  SYMPTOMS TO REPORT IMMEDIATELY:  Following lower endoscopy (colonoscopy or flexible sigmoidoscopy):  Excessive amounts of blood in the stool  Significant tenderness or worsening of abdominal pains  Swelling of the abdomen that is new, acute  Fever of 100F or higher  For urgent or emergent issues, a gastroenterologist can be reached at any hour by calling [phone]. Do  not use MyChart messaging for urgent concerns.    DIET:  We do recommend a small meal at first, but then you may proceed to your regular diet.  Drink plenty of fluids but you should avoid alcoholic beverages for 24 hours.  ACTIVITY:  You should plan to take it easy for the rest of today and you should NOT DRIVE or use heavy machinery until tomorrow (because of the sedation medicines used during the test).    FOLLOW UP: Our staff will call the number listed on your records the next business day following your procedure.  We will call around 7:15- 8:00 am to check on you and address any questions or concerns that you may have regarding the information given to you following your procedure. If we do not reach you, we will leave a message.     If any biopsies were taken you will be contacted by phone or by letter within the next 1-3 weeks.  Please call us  at [phone] if you have not heard about the biopsies in 3 weeks.    SIGNATURES/CONFIDENTIALITY: You and/or your care partner have signed paperwork which will be entered into your electronic medical record.  These signatures attest to the fact that that the information above on your After Visit Summary has been reviewed and is understood.  Full responsibility of the confidentiality of this discharge information lies with you and/or your care-partner.

## 2024-01-15 NOTE — Progress Notes (Signed)
 A/o x 3, VSS, good SR's, pleased with anesthesia, report to RN

## 2024-01-15 NOTE — Op Note (Signed)
 [facility] Patient Name: Lisa Nolan Procedure Date: 01/15/2024 10:08 AM MRN: [id number] Endoscopist: Lupita FORBES Commander , MD, [id number] Age: 68 Referring MD:  Date of Birth: 04-03-1955 Gender: Female Account #: [id number] Procedure:                Colonoscopy Indications:              High risk colon cancer surveillance: Personal                            history of colonic polyps, Last colonoscopy: 2019 Medicines:                Monitored Anesthesia Care Procedure:                Pre-Anesthesia Assessment:                           - Prior to the procedure, a History and Physical                            was performed, and patient medications and                            allergies were reviewed. The patient's tolerance of                            previous anesthesia was also reviewed. The risks                            and benefits of the procedure and the sedation                            options and risks were discussed with the patient.                            All questions were answered, and informed consent                            was obtained. Prior Anticoagulants: The patient has                            taken no anticoagulant or antiplatelet agents. ASA                            Grade Assessment: II - A patient with mild systemic                            disease. After reviewing the risks and benefits,                            the patient was deemed in satisfactory condition to                            undergo the procedure.  After obtaining informed consent, the colonoscope                            was passed under direct vision. Throughout the                            procedure, the patient's blood pressure, pulse, and                            oxygen saturations were monitored continuously. The                            Olympus Scope SN [id number] was introduced through the                            anus  and advanced to the the cecum, identified by                            appendiceal orifice and ileocecal valve. The                            colonoscopy was performed without difficulty. The                            patient tolerated the procedure well. The quality                            of the bowel preparation was good. The ileocecal                            valve, appendiceal orifice, and rectum were                            photographed. The bowel preparation used was SUPREP                            via split dose instruction. Scope In: 10:18:01 AM Scope Out: 10:33:05 AM Scope Withdrawal Time: 0 hours 12 minutes 2 seconds  Total Procedure Duration: 0 hours 15 minutes 4 seconds  Findings:                 Hemorrhoids were found on perianal exam.                           Two sessile polyps were found in the cecum. The                            polyps were 3 to 5 mm in size. These polyps were                            removed with a cold snare. Resection and retrieval                            were complete.  Verification of patient                            identification for the specimen was done. Estimated                            blood loss was minimal.                           Multiple diverticula were found in the sigmoid                            colon.                           Angioectasias were found at the anus and distal                            rectum.                           The exam was otherwise without abnormality on                            direct and retroflexion views. Complications:            No immediate complications. Estimated Blood Loss:     Estimated blood loss was minimal. Impression:               - Hemorrhoids found on perianal exam.                           - Two 3 to 5 mm polyps in the cecum, removed with a                            cold snare. Resected and retrieved.                           - Diverticulosis in the sigmoid  colon.                           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                           - Personal history of colonic polyps. 08/2012 1                            diminutive adenoma  and 3 diminutive hyperplastic                            polyps                           01/03/2018 5 diminutive polyps removed 1 adenoma                            others hyperplastic Recommendation:           - Patient has a contact number available for                            emergencies. The signs and symptoms of potential                            delayed complications were discussed with the                            patient. Return to normal activities tomorrow.                            Written discharge instructions were provided to the                            patient.                           - Resume previous diet.                           - Continue present medications.                           - Await pathology results.                           - Repeat colonoscopy is recommended for                            surveillance. The colonoscopy date will be                            determined after pathology results from today's                            exam become available for review. Lupita FORBES Commander, MD 01/15/2024 10:57:01 AM This report has been signed electronically.

## 2024-01-16 ENCOUNTER — Telehealth: Payer: Self-pay | Admitting: *Deleted

## 2024-01-16 NOTE — Telephone Encounter (Signed)
  Follow up Call-     01/15/2024    8:59 AM  Call back number  Post procedure Call Back phone  # [phone]  Permission to leave phone message Yes     Patient questions:  Mailbox is full.

## 2024-01-17 LAB — SURGICAL PATHOLOGY

## 2024-01-28 ENCOUNTER — Ambulatory Visit: Payer: Self-pay | Admitting: Internal Medicine

## 2024-01-28 DIAGNOSIS — Z860101 Personal history of adenomatous and serrated colon polyps: Secondary | ICD-10-CM

## 2024-09-24 ENCOUNTER — Other Ambulatory Visit: Admitting: Oncology
